# Patient Record
Sex: Male | Born: 1985
Health system: Southern US, Community
[De-identification: ages and names within clinical notes are randomized; demographics above are authoritative.]

---

## 2015-07-14 DIAGNOSIS — H5213 Myopia, bilateral: Secondary | ICD-10-CM | POA: Diagnosis not present

## 2016-07-30 DIAGNOSIS — H5213 Myopia, bilateral: Secondary | ICD-10-CM | POA: Diagnosis not present

## 2017-03-12 MED FILL — diazePAM 10 MG TABS: 10 | 1 days supply | Qty: 1 | Fill #0

## 2017-03-12 MED FILL — HYDROCODON-APAP 10-325: 10-325 | 3 days supply | Qty: 15 | Fill #0

## 2017-03-12 MED FILL — CEPHALEXIN 500 MG CAPSULE: 500 | 5 days supply | Qty: 15 | Fill #0

## 2017-03-19 DIAGNOSIS — N4601 Organic azoospermia: Secondary | ICD-10-CM | POA: Insufficient documentation

## 2017-05-12 NOTE — Progress Notes (Addendum)
Subjective:    Patient ID: John Barnes, male    DOB: Jun 10, 1985, 32 y.o.   MRN: [id number]  HPI:  John Barnes is here to establish as a new pt.  He is a pleasant 32 year old male.  PMH: He denies chronic medical conditions/daily medications. He estimates to drink >80 oz water/day and intermittently follows low CHO diets, he has lost >25 lbs since Fall 2018 He denies formal exercise, but is interested in resuming regular wt training program. He enjoys an occasional beer and uses smokeless tobacco- 1 can will last 4 days. He is happily married and has three children ages 61,11, 48 He is a Presenter, broadcasting   Patient Care Team    Relationship Specialty Notifications Start End  Danford, Jinny Blossom, NP PCP - General Family Medicine  04/06/17     Patient Active Problem List   Diagnosis Date Noted  . Healthcare maintenance 05/13/2017     History reviewed. No pertinent past medical history.   History reviewed. No pertinent surgical history.   Family History  Problem Relation Age of Onset  . Diabetes Maternal Grandfather      Social History   Substance and Sexual Activity  Drug Use Never     Social History   Substance and Sexual Activity  Alcohol Use Yes   Comment: occ     Social History   Tobacco Use  Smoking Status Never Smoker  Smokeless Tobacco Never Used     No outpatient encounter medications on file as of 05/13/2017.   No facility-administered encounter medications on file as of 05/13/2017.     Allergies: Patient has no known allergies.  Body mass index is 29.14 kg/m.  Blood pressure 123/76, pulse 90, height 5\' 9"  (1.753 m), weight 197 lb 4.8 oz (89.5 kg), SpO2 98 %.  Review of Systems  Constitutional: Negative for activity change, appetite change, chills, diaphoresis, fatigue, fever and unexpected weight change.  Eyes: Negative for visual disturbance.  Respiratory: Negative for cough, chest tightness, shortness of breath, wheezing and stridor.    Cardiovascular: Negative for chest pain, palpitations and leg swelling.  Gastrointestinal: Negative for abdominal distention, abdominal pain, blood in stool, constipation, diarrhea, nausea and vomiting.  Genitourinary: Negative for difficulty urinating, flank pain and hematuria.  Musculoskeletal: Negative for arthralgias, back pain, gait problem, joint swelling, myalgias, neck pain and neck stiffness.  Skin: Negative for color change, pallor, rash and wound.  Neurological: Negative for dizziness and headaches.  Hematological: Does not bruise/bleed easily.  Psychiatric/Behavioral: Negative for decreased concentration, dysphoric mood, hallucinations, self-injury, sleep disturbance and suicidal ideas. The patient is not nervous/anxious and is not hyperactive.        Objective:   Physical Exam  Constitutional: He appears well-developed and well-nourished. No distress.  HENT:  Head: Normocephalic and atraumatic.  Right Ear: External ear normal.  Left Ear: External ear normal.  Eyes: Pupils are equal, round, and reactive to light. Conjunctivae are normal.  Cardiovascular: Normal rate, regular rhythm, normal heart sounds and intact distal pulses.  No murmur heard. Pulmonary/Chest: Effort normal and breath sounds normal. No respiratory distress. He has no wheezes. He has no rales. He exhibits no tenderness.  Neurological: He is alert.  Skin: Skin is warm and dry. No rash noted. He is not diaphoretic. No erythema. No pallor.  Psychiatric: He has a normal mood and affect. His behavior is normal. Judgment and thought content normal.  Nursing note and vitals reviewed.     Assessment & Plan:  1. Healthcare maintenance     Healthcare maintenance Increase regular exercise, get that weight lifting group together! Continue excellent water intake and try to reduce-stop smokeless tobacco use. Recommend fasting labs this summer, then schedule complete physical a week later.    FOLLOW-UP:   Return in about 3 months (around 08/12/2017) for Fasting Labs, CPE.

## 2017-05-13 ENCOUNTER — Encounter: Payer: Self-pay | Admitting: Adult Health

## 2017-05-13 ENCOUNTER — Ambulatory Visit (INDEPENDENT_AMBULATORY_CARE_PROVIDER_SITE_OTHER): Payer: No Typology Code available for payment source | Admitting: Adult Health

## 2017-05-13 VITALS — BP 123/76 | HR 90 | Ht 69.0 in | Wt 197.3 lb

## 2017-05-13 DIAGNOSIS — Z0000 Encounter for general adult medical examination without abnormal findings: Secondary | ICD-10-CM | POA: Insufficient documentation

## 2017-05-13 NOTE — Assessment & Plan Note (Signed)
Increase regular exercise, get that weight lifting group together! Continue excellent water intake and try to reduce-stop smokeless tobacco use. Recommend fasting labs this summer, then schedule complete physical a week later.

## 2017-05-13 NOTE — Patient Instructions (Signed)
Mediterranean Diet A Mediterranean diet refers to food and lifestyle choices that are based on the traditions of countries located on the [place] Sea. This way of eating has been shown to help prevent certain conditions and improve outcomes for people who have chronic diseases, like kidney disease and heart disease. What are tips for following this plan? Lifestyle  Cook and eat meals together with your family, when possible.  Drink enough fluid to keep your urine clear or pale yellow.  Be physically active every day. This includes: ? Aerobic exercise like running or swimming. ? Leisure activities like gardening, walking, or housework.  Get 7-8 hours of sleep each night.  If recommended by your health care provider, drink red wine in moderation. This means 1 glass a day for nonpregnant women and 2 glasses a day for men. A glass of wine equals 5 oz (150 mL). Reading food labels  Check the serving size of packaged foods. For foods such as rice and pasta, the serving size refers to the amount of cooked product, not dry.  Check the total fat in packaged foods. Avoid foods that have saturated fat or trans fats.  Check the ingredients list for added sugars, such as corn syrup. Shopping  At the grocery store, buy most of your food from the areas near the walls of the store. This includes: ? Fresh fruits and vegetables (produce). ? Grains, beans, nuts, and seeds. Some of these may be available in unpackaged forms or large amounts (in bulk). ? Fresh seafood. ? Poultry and eggs. ? Low-fat dairy products.  Buy whole ingredients instead of prepackaged foods.  Buy fresh fruits and vegetables in-season from local farmers markets.  Buy frozen fruits and vegetables in resealable bags.  If you do not have access to quality fresh seafood, buy precooked frozen shrimp or canned fish, such as tuna, salmon, or sardines.  Buy small amounts of raw or cooked vegetables, salads, or olives from the  deli or salad bar at your store.  Stock your pantry so you always have certain foods on hand, such as olive oil, canned tuna, canned tomatoes, rice, pasta, and beans. Cooking  Cook foods with extra-virgin olive oil instead of using butter or other vegetable oils.  Have meat as a side dish, and have vegetables or grains as your main dish. This means having meat in small portions or adding small amounts of meat to foods like pasta or stew.  Use beans or vegetables instead of meat in common dishes like chili or lasagna.  Experiment with different cooking methods. Try roasting or broiling vegetables instead of steaming or sauteing them.  Add frozen vegetables to soups, stews, pasta, or rice.  Add nuts or seeds for added healthy fat at each meal. You can add these to yogurt, salads, or vegetable dishes.  Marinate fish or vegetables using olive oil, lemon juice, garlic, and fresh herbs. Meal planning  Plan to eat 1 vegetarian meal one day each week. Try to work up to 2 vegetarian meals, if possible.  Eat seafood 2 or more times a week.  Have healthy snacks readily available, such as: ? Vegetable sticks with hummus. ? Greek yogurt. ? Fruit and nut trail mix.  Eat balanced meals throughout the week. This includes: ? Fruit: 2-3 servings a day ? Vegetables: 4-5 servings a day ? Low-fat dairy: 2 servings a day ? Fish, poultry, or lean meat: 1 serving a day ? Beans and legumes: 2 or more servings a week ? Nuts   and seeds: 1-2 servings a day ? Whole grains: 6-8 servings a day ? Extra-virgin olive oil: 3-4 servings a day  Limit red meat and sweets to only a few servings a month What are my food choices?  Mediterranean diet ? Recommended ? Grains: Whole-grain pasta. Brown rice. Bulgar wheat. Polenta. Couscous. Whole-wheat bread. Orpah Cobbatmeal. Quinoa. ? Vegetables: Artichokes. Beets. Broccoli. Cabbage. Carrots. Eggplant. Green beans. Chard. Kale. Spinach. Onions. Leeks. Peas. Squash.  Tomatoes. Peppers. Radishes. ? Fruits: Apples. Apricots. Avocado. Berries. Bananas. Cherries. Dates. Figs. Grapes. Lemons. Melon. Oranges. Peaches. Plums. Pomegranate. ? Meats and other protein foods: Beans. Almonds. Sunflower seeds. Pine nuts. Peanuts. Cod. Salmon. Scallops. Shrimp. Tuna. Tilapia. Clams. Oysters. Eggs. ? Dairy: Low-fat milk. Cheese. Greek yogurt. ? Beverages: Water. Red wine. Herbal tea. ? Fats and oils: Extra virgin olive oil. Avocado oil. Grape seed oil. ? Sweets and desserts: [place] yogurt with honey. Baked apples. Poached pears. Trail mix. ? Seasoning and other foods: Basil. Cilantro. Coriander. Cumin. Mint. Parsley. Sage. Rosemary. Tarragon. Garlic. Oregano. Thyme. Pepper. Balsalmic vinegar. Tahini. Hummus. Tomato sauce. Olives. Mushrooms. ? Limit these ? Grains: Prepackaged pasta or rice dishes. Prepackaged cereal with added sugar. ? Vegetables: Deep fried potatoes (french fries). ? Fruits: Fruit canned in syrup. ? Meats and other protein foods: Beef. Pork. Lamb. Poultry with skin. Hot dogs. Tomasa BlaseBacon. ? Dairy: Ice cream. Sour cream. Whole milk. ? Beverages: Juice. Sugar-sweetened soft drinks. Beer. Liquor and spirits. ? Fats and oils: Butter. Canola oil. Vegetable oil. Beef fat (tallow). Lard. ? Sweets and desserts: Cookies. Cakes. Pies. Candy. ? Seasoning and other foods: Mayonnaise. Premade sauces and marinades. ? The items listed may not be a complete list. Talk with your dietitian about what dietary choices are right for you. Summary  The Mediterranean diet includes both food and lifestyle choices.  Eat a variety of fresh fruits and vegetables, beans, nuts, seeds, and whole grains.  Limit the amount of red meat and sweets that you eat.  Talk with your health care provider about whether it is safe for you to drink red wine in moderation. This means 1 glass a day for nonpregnant women and 2 glasses a day for men. A glass of wine equals 5 oz (150 mL). This information  is not intended to replace advice given to you by your health care provider. Make sure you discuss any questions you have with your health care provider. Document Released: 08/29/2015 Document Revised: 10/01/2015 Document Reviewed: 08/29/2015 Elsevier Interactive Patient Education  2018 [employer]  Increase regular exercise, get that weight lifting group together! Continue excellent water intake and try to reduce-stop smokeless tobacco use. Recommend fasting labs this summer, then schedule complete physical a week later. WELCOME TO THE PRACTICE!

## 2017-08-02 ENCOUNTER — Other Ambulatory Visit: Payer: Self-pay

## 2017-08-02 ENCOUNTER — Other Ambulatory Visit (INDEPENDENT_AMBULATORY_CARE_PROVIDER_SITE_OTHER): Payer: No Typology Code available for payment source

## 2017-08-02 DIAGNOSIS — Z0000 Encounter for general adult medical examination without abnormal findings: Secondary | ICD-10-CM

## 2017-08-02 MED FILL — CHLORHEXIDINE 0.12% RINSE: 0.12 | 15 days supply | Qty: 473 | Fill #0

## 2017-08-03 LAB — LIPID PANEL
CHOL/HDL RATIO: 7.2 ratio — AB (ref 0.0–5.0)
Cholesterol, Total: 194 mg/dL (ref 100–199)
HDL: 27 mg/dL — AB (ref >39)
LDL CALC: 135 mg/dL — AB (ref 0–99)
Triglycerides: 158 mg/dL — ABNORMAL HIGH (ref 0–149)
VLDL Cholesterol Cal: 32 mg/dL (ref 5–40)

## 2017-08-03 LAB — COMPREHENSIVE METABOLIC PANEL
A/G RATIO: 2.2 (ref 1.2–2.2)
ALBUMIN: 4.6 g/dL (ref 3.5–5.5)
ALT: 20 IU/L (ref 0–44)
AST: 17 IU/L (ref 0–40)
Alkaline Phosphatase: 55 IU/L (ref 39–117)
BILIRUBIN TOTAL: 0.3 mg/dL (ref 0.0–1.2)
BUN / CREAT RATIO: 15 (ref 9–20)
BUN: 14 mg/dL (ref 6–20)
CHLORIDE: 104 mmol/L (ref 96–106)
CO2: 21 mmol/L (ref 20–29)
Calcium: 9.6 mg/dL (ref 8.7–10.2)
Creatinine, Ser: 0.95 mg/dL (ref 0.76–1.27)
GFR calc non Af Amer: 107 mL/min/{1.73_m2} (ref >59)
GFR, EST AFRICAN AMERICAN: 124 mL/min/{1.73_m2} (ref >59)
GLOBULIN, TOTAL: 2.1 g/dL (ref 1.5–4.5)
Glucose: 78 mg/dL (ref 65–99)
Potassium: 4.5 mmol/L (ref 3.5–5.2)
SODIUM: 140 mmol/L (ref 134–144)
Total Protein: 6.7 g/dL (ref 6.0–8.5)

## 2017-08-03 LAB — CBC WITH DIFFERENTIAL/PLATELET
Basophils Absolute: 0.1 10*3/uL (ref 0.0–0.2)
Basos: 1 %
EOS (ABSOLUTE): 0.2 10*3/uL (ref 0.0–0.4)
Eos: 3 %
HEMOGLOBIN: 15.3 g/dL (ref 13.0–17.7)
Hematocrit: 46.1 % (ref 37.5–51.0)
Immature Grans (Abs): 0 10*3/uL (ref 0.0–0.1)
Immature Granulocytes: 0 %
LYMPHS ABS: 1.6 10*3/uL (ref 0.7–3.1)
Lymphs: 30 %
MCH: 28.4 pg (ref 26.6–33.0)
MCHC: 33.2 g/dL (ref 31.5–35.7)
MCV: 86 fL (ref 79–97)
MONOCYTES: 8 %
MONOS ABS: 0.4 10*3/uL (ref 0.1–0.9)
NEUTROS ABS: 3.1 10*3/uL (ref 1.4–7.0)
Neutrophils: 58 %
Platelets: 277 10*3/uL (ref 150–450)
RBC: 5.38 x10E6/uL (ref 4.14–5.80)
RDW: 13.3 % (ref 12.3–15.4)
WBC: 5.3 10*3/uL (ref 3.4–10.8)

## 2017-08-03 LAB — HEMOGLOBIN A1C
Est. average glucose Bld gHb Est-mCnc: 100 mg/dL
Hgb A1c MFr Bld: 5.1 % (ref 4.8–5.6)

## 2017-08-03 LAB — TSH: TSH: 1.47 u[IU]/mL (ref 0.450–4.500)

## 2017-08-05 NOTE — Progress Notes (Signed)
Subjective:    Patient ID: John Barnes, male    DOB: May 14, 1985, 32 y.o.   MRN: [id number]  HPI:  05/13/17 OV: John Barnes is here to establish as a new pt.  He is a pleasant 32 year old male.  PMH: He denies chronic medical conditions/daily medications. He estimates to drink >80 oz water/day and intermittently follows low CHO diets, he has lost >25 lbs since Fall 2018 He denies formal exercise, but is interested in resuming regular wt training program. He enjoys an occasional beer and uses smokeless tobacco- 1 can will last 4 days. 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  08/09/17 OV: John Barnes is here for CPE He denies acute complaints He estimates to drink >80 ox water/day His diet consists of "whatever I want to eat", he has gained >5 lbs since last OV in Jan 2019 He denies cigarette use, however uses smokeless tobacco- estimates 1.5 cans will last/week He declined tobacco cessation today He denies formal exercise, however enjoys hiking with his family He seldom drinks ETOH Reviewed all recent labs- LDL  Reviewed most recent labs- LDL elevated 135, HDL 27  Healthcare Maintenance: Immunizations-Tdap updated today Colonoscopy-not indicated   Patient Care Team    Relationship Specialty Notifications Start End  Julaine Fusianford, Katy D, NP PCP - General Family Medicine  04/06/17     Patient Active Problem List   Diagnosis Date Noted  . Elevated LDL cholesterol level 08/09/2017  . Smokeless tobacco use 08/09/2017  . Healthcare maintenance 05/13/2017     History reviewed. No pertinent past medical history.   History reviewed. No pertinent surgical history.   Family History  Problem Relation Age of Onset  . Diabetes Maternal Grandfather      Social History   Substance and Sexual Activity  Drug Use Never     Social History   Substance and Sexual Activity  Alcohol Use Yes   Comment: occ     Social History   Tobacco  Use  Smoking Status Never Smoker  Smokeless Tobacco Never Used     No outpatient encounter medications on file as of 08/09/2017.   No facility-administered encounter medications on file as of 08/09/2017.     Allergies: Patient has no known allergies.  Body mass index is 30.05 kg/m.  Blood pressure 125/73, pulse 85, height 5\' 9"  (1.753 m), weight 203 lb 8 oz (92.3 kg), SpO2 99 %.  Review of Systems  Constitutional: Negative for activity change, appetite change, chills, diaphoresis, fatigue, fever and unexpected weight change.  Eyes: Negative for visual disturbance.  Respiratory: Negative for cough, chest tightness, shortness of breath, wheezing and stridor.   Cardiovascular: Negative for chest pain, palpitations and leg swelling.  Gastrointestinal: Negative for abdominal distention, abdominal pain, blood in stool, constipation, diarrhea, nausea and vomiting.  Genitourinary: Negative for difficulty urinating, flank pain and hematuria.  Musculoskeletal: Negative for arthralgias, back pain, gait problem, joint swelling, myalgias, neck pain and neck stiffness.  Skin: Negative for color change, pallor, rash and wound.  Neurological: Negative for dizziness and headaches.  Hematological: Does not bruise/bleed easily.  Psychiatric/Behavioral: Negative for decreased concentration, dysphoric mood, hallucinations, self-injury, sleep disturbance and suicidal ideas. The patient is not nervous/anxious and is not hyperactive.        Objective:   Physical Exam  Constitutional: He is oriented to person, place, and time. He appears well-developed and well-nourished. No distress.  HENT:  Head: Normocephalic and atraumatic.  Right Ear: External ear normal. Tympanic membrane is not erythematous and not bulging. No decreased hearing is noted.  Left Ear: External ear normal. Tympanic membrane is not erythematous and not bulging. No decreased hearing is noted.  Nose: No mucosal edema or rhinorrhea.  Right sinus exhibits no maxillary sinus tenderness and no frontal sinus tenderness. Left sinus exhibits no maxillary sinus tenderness and no frontal sinus tenderness.  Mouth/Throat: Uvula is midline, oropharynx is clear and moist and mucous membranes are normal.  Eyes: Pupils are equal, round, and reactive to light. Conjunctivae are normal.  Neck: Normal range of motion. Neck supple.  Cardiovascular: Normal rate, regular rhythm, normal heart sounds and intact distal pulses.  No murmur heard. Pulmonary/Chest: Effort normal and breath sounds normal. No respiratory distress. He has no wheezes. He has no rales. He exhibits no tenderness.  Abdominal: Soft. Bowel sounds are normal. He exhibits no distension and no mass. There is no tenderness. There is no rebound and no guarding.  Genitourinary:  Genitourinary Comments: Declined DRE/genital exam  Musculoskeletal: Normal range of motion.  Lymphadenopathy:    He has no cervical adenopathy.  Neurological: He is alert and oriented to person, place, and time. Coordination normal.  Skin: Skin is warm and dry. No rash noted. He is not diaphoretic. No erythema. No pallor.  Psychiatric: He has a normal mood and affect. His behavior is normal. Judgment and thought content normal.  Nursing note and vitals reviewed.     Assessment & Plan:   1. Screening for HIV (human immunodeficiency virus)   2. Need for Tdap vaccination   3. Healthcare maintenance   4. Elevated LDL cholesterol level   5. Smokeless tobacco use     Healthcare maintenance Increase water intake, strive for at least 100 ounces/day.   Follow Mediterranean diet Increase regular exercise.  Recommend at least 30 minutes daily, 5 days per week of walking, jogging, biking, swimming, YouTube/Pinterest workout videos. Recommend annual physical with fasting labs.  Elevated LDL cholesterol level 08/02/17 Tot 194 TGs 158 HDL 27 LDL 135 Mediterranean diet, increase regular  exercise  Smokeless tobacco use 1.5 can over 1 week Declined tobacco cessation  FOLLOW-UP:  Return in about 1 year (around 08/10/2018).

## 2017-08-09 ENCOUNTER — Encounter: Payer: Self-pay | Admitting: Adult Health

## 2017-08-09 ENCOUNTER — Ambulatory Visit (INDEPENDENT_AMBULATORY_CARE_PROVIDER_SITE_OTHER): Payer: No Typology Code available for payment source | Admitting: Adult Health

## 2017-08-09 VITALS — BP 125/73 | HR 85 | Ht 69.0 in | Wt 203.5 lb

## 2017-08-09 DIAGNOSIS — Z720 Tobacco use: Secondary | ICD-10-CM | POA: Diagnosis not present

## 2017-08-09 DIAGNOSIS — Z23 Encounter for immunization: Secondary | ICD-10-CM

## 2017-08-09 DIAGNOSIS — Z114 Encounter for screening for human immunodeficiency virus [HIV]: Secondary | ICD-10-CM

## 2017-08-09 DIAGNOSIS — Z0000 Encounter for general adult medical examination without abnormal findings: Secondary | ICD-10-CM

## 2017-08-09 DIAGNOSIS — E7800 Pure hypercholesterolemia, unspecified: Secondary | ICD-10-CM | POA: Diagnosis not present

## 2017-08-09 NOTE — Patient Instructions (Signed)
Mediterranean Diet A Mediterranean diet refers to food and lifestyle choices that are based on the traditions of countries located on the [place]. This way of eating has been shown to help prevent certain conditions and improve outcomes for people who have chronic diseases, like kidney disease and heart disease. What are tips for following this plan? Lifestyle  Cook and eat meals together with your family, when possible.  Drink enough fluid to keep your urine clear or pale yellow.  Be physically active every day. This includes: ? Aerobic exercise like running or swimming. ? Leisure activities like gardening, walking, or housework.  Get 7-8 hours of sleep each night.  If recommended by your health care provider, drink red wine in moderation. This means 1 glass a day for nonpregnant women and 2 glasses a day for men. A glass of wine equals 5 oz (150 mL). Reading food labels  Check the serving size of packaged foods. For foods such as rice and pasta, the serving size refers to the amount of cooked product, not dry.  Check the total fat in packaged foods. Avoid foods that have saturated fat or trans fats.  Check the ingredients list for added sugars, such as corn syrup. Shopping  At the grocery store, buy most of your food from the areas near the walls of the store. This includes: ? Fresh fruits and vegetables (produce). ? Grains, beans, nuts, and seeds. Some of these may be available in unpackaged forms or large amounts (in bulk). ? Fresh seafood. ? Poultry and eggs. ? Low-fat dairy products.  Buy whole ingredients instead of prepackaged foods.  Buy fresh fruits and vegetables in-season from local farmers markets.  Buy frozen fruits and vegetables in resealable bags.  If you do not have access to quality fresh seafood, buy precooked frozen shrimp or canned fish, such as tuna, salmon, or sardines.  Buy small amounts of raw or cooked vegetables, salads, or olives from the  deli or salad bar at your store.  Stock your pantry so you always have certain foods on hand, such as olive oil, canned tuna, canned tomatoes, rice, pasta, and beans. Cooking  Cook foods with extra-virgin olive oil instead of using butter or other vegetable oils.  Have meat as a side dish, and have vegetables or grains as your main dish. This means having meat in small portions or adding small amounts of meat to foods like pasta or stew.  Use beans or vegetables instead of meat in common dishes like chili or lasagna.  Experiment with different cooking methods. Try roasting or broiling vegetables instead of steaming or sauteing them.  Add frozen vegetables to soups, stews, pasta, or rice.  Add nuts or seeds for added healthy fat at each meal. You can add these to yogurt, salads, or vegetable dishes.  Marinate fish or vegetables using olive oil, lemon juice, garlic, and fresh herbs. Meal planning  Plan to eat 1 vegetarian meal one day each week. Try to work up to 2 vegetarian meals, if possible.  Eat seafood 2 or more times a week.  Have healthy snacks readily available, such as: ? Vegetable sticks with hummus. ? Greek yogurt. ? Fruit and nut trail mix.  Eat balanced meals throughout the week. This includes: ? Fruit: 2-3 servings a day ? Vegetables: 4-5 servings a day ? Low-fat dairy: 2 servings a day ? Fish, poultry, or lean meat: 1 serving a day ? Beans and legumes: 2 or more servings a week ? Nuts   and seeds: 1-2 servings a day ? Whole grains: 6-8 servings a day ? Extra-virgin olive oil: 3-4 servings a day  Limit red meat and sweets to only a few servings a month What are my food choices?  Mediterranean diet ? Recommended ? Grains: Whole-grain pasta. Brown rice. Bulgar wheat. Polenta. Couscous. Whole-wheat bread. [place]. ? Vegetables: Artichokes. Beets. Broccoli. Cabbage. Carrots. Eggplant. Green beans. Chard. Kale. Spinach. Onions. Leeks. Peas. Squash.  Tomatoes. Peppers. Radishes. ? Fruits: Apples. Apricots. Avocado. Berries. Bananas. Cherries. Dates. Figs. Grapes. Lemons. Melon. Oranges. Peaches. Plums. Pomegranate. ? Meats and other protein foods: Beans. Almonds. Sunflower seeds. Pine nuts. Peanuts. [place]. Salmon. Scallops. Shrimp. [place]. Tilapia. Clams. Oysters. Eggs. ? Dairy: Low-fat milk. Cheese. Greek yogurt. ? Beverages: Water. Red wine. Herbal tea. ? Fats and oils: Extra virgin olive oil. Avocado oil. Grape seed oil. ? Sweets and desserts: [place] yogurt with honey. Baked apples. Poached pears. Trail mix. ? Seasoning and other foods: Basil. Cilantro. Coriander. Cumin. Mint. Parsley. Sage. Rosemary. Tarragon. Garlic. Oregano. Thyme. Pepper. Balsalmic vinegar. Tahini. Hummus. Tomato sauce. Olives. Mushrooms. ? Limit these ? Grains: Prepackaged pasta or rice dishes. Prepackaged cereal with added sugar. ? Vegetables: Deep fried potatoes (french fries). ? Fruits: Fruit canned in syrup. ? Meats and other protein foods: Beef. Pork. Lamb. Poultry with skin. Hot dogs. Berniece Salines. ? Dairy: Ice cream. Sour cream. Whole milk. ? Beverages: Juice. Sugar-sweetened soft drinks. Beer. Liquor and spirits. ? Fats and oils: Butter. Canola oil. Vegetable oil. Beef fat (tallow). Lard. ? Sweets and desserts: Cookies. Cakes. Pies. Candy. ? Seasoning and other foods: Mayonnaise. Premade sauces and marinades. ? The items listed may not be a complete list. Talk with your dietitian about what dietary choices are right for you. Summary  The Mediterranean diet includes both food and lifestyle choices.  Eat a variety of fresh fruits and vegetables, beans, nuts, seeds, and whole grains.  Limit the amount of red meat and sweets that you eat.  Talk with your health care provider about whether it is safe for you to drink red wine in moderation. This means 1 glass a day for nonpregnant women and 2 glasses a day for men. A glass of wine equals 5 oz (150 mL). This information  is not intended to replace advice given to you by your health care provider. Make sure you discuss any questions you have with your health care provider. Document Released: 08/29/2015 Document Revised: 10/01/2015 Document Reviewed: 08/29/2015 Elsevier Interactive Patient Education  2018 [employer].    Exercising to [employer] Exercising can help you to lose weight. In order to lose weight through exercise, you need to do vigorous-intensity exercise. You can tell that you are exercising with vigorous intensity if you are breathing very hard and fast and cannot hold a conversation while exercising. Moderate-intensity exercise helps to maintain your current weight. You can tell that you are exercising at a moderate level if you have a higher heart rate and faster breathing, but you are still able to hold a conversation. How often should I exercise? Choose an activity that you enjoy and set realistic goals. Your health care provider can help you to make an activity plan that works for you. Exercise regularly as directed by your health care provider. This may include:  Doing resistance training twice each week, such as: ? Push-ups. ? Sit-ups. ? Lifting weights. ? Using resistance bands.  Doing a given intensity of exercise for a given amount of time. Choose from these options: ?  150 minutes of moderate-intensity exercise every week. ? 75 minutes of vigorous-intensity exercise every week. ? A mix of moderate-intensity and vigorous-intensity exercise every week.  Children, pregnant women, people who are out of shape, people who are overweight, and older adults may need to consult a health care provider for individual recommendations. If you have any sort of medical condition, be sure to consult your health care provider before starting a new exercise program. What are some activities that can help me to lose weight?  Walking at a rate of at least 4.5 miles an hour.  Jogging or running at a  rate of 5 miles per hour.  Biking at a rate of at least 10 miles per hour.  Lap swimming.  Roller-skating or in-line skating.  Cross-country skiing.  Vigorous competitive sports, such as football, basketball, and soccer.  Jumping rope.  Aerobic dancing. How can I be more active in my day-to-day activities?  Use the stairs instead of the elevator.  Take a walk during your lunch break.  If you drive, park your car farther away from work or school.  If you take public transportation, get off one stop early and walk the rest of the way.  Make all of your phone calls while standing up and walking around.  Get up, stretch, and walk around every 30 minutes throughout the day. What guidelines should I follow while exercising?  Do not exercise so much that you hurt yourself, feel dizzy, or get very short of breath.  Consult your health care provider prior to starting a new exercise program.  Wear comfortable clothes and shoes with good support.  Drink plenty of water while you exercise to prevent dehydration or heat stroke. Body water is lost during exercise and must be replaced.  Work out until you breathe faster and your heart beats faster. This information is not intended to replace advice given to you by your health care provider. Make sure you discuss any questions you have with your health care provider. Document Released: 02/07/2010 Document Revised: 06/13/2015 Document Reviewed: 06/08/2013 Elsevier Interactive Patient Education  2018 [employer]  Increase water intake, strive for at least 100 ounces/day.   Follow Mediterranean diet Increase regular exercise.  Recommend at least 30 minutes daily, 5 days per week of walking, jogging, biking, swimming, YouTube/Pinterest workout videos. Recommend annual physical with fasting labs. NICE TO SEE YOU!

## 2017-08-09 NOTE — Assessment & Plan Note (Signed)
08/02/17 Tot 194 TGs 158 HDL 27 LDL 135 Mediterranean diet, increase regular exercise

## 2017-08-09 NOTE — Assessment & Plan Note (Signed)
1.5 can over 1 week Declined tobacco cessation

## 2017-08-09 NOTE — Assessment & Plan Note (Signed)
Increase water intake, strive for at least 100 ounces/day.   Follow Mediterranean diet Increase regular exercise.  Recommend at least 30 minutes daily, 5 days per week of walking, jogging, biking, swimming, YouTube/Pinterest workout videos. Recommend annual physical with fasting labs.

## 2017-08-10 LAB — HIV ANTIBODY (ROUTINE TESTING W REFLEX): HIV SCREEN 4TH GENERATION: NONREACTIVE

## 2018-03-15 ENCOUNTER — Telehealth: Payer: No Typology Code available for payment source | Admitting: Physician Assistant

## 2018-03-15 DIAGNOSIS — B027 Disseminated zoster: Secondary | ICD-10-CM

## 2018-03-15 NOTE — Progress Notes (Signed)
You have what appears to be a shingles rash.  It is very odd that it would be on 2 different dermatomes.  I recommend that you seek the care of urgent care or the emergency room.   NOTE: If you entered your credit card information for this eVisit, you will not be charged. You may see a "hold" on your card for the $30 but that hold will drop off and you will not have a charge processed.  If you are having a true medical emergency please call 911.  If you need an urgent face to face visit,  has four urgent care centers for your convenience.  If you need care fast and have a high deductible or no insurance consider:   [email or web address] to reserve your spot online an avoid wait times  [facility] [address], [address] [place], [place] [address] 8 am to 8 pm Monday-Friday 10 am to 4 pm Saturday-Sunday *Across the street from [employer]  [address] [place], [address] 8 am to 5 pm Monday-Friday * In the [facility] on the [address]   The following sites will take your  insurance:  . [facility]  [phone] Get Driving Directions Find a Provider at this Location  [address] [place], [place] [address] . 10 am to 8 pm Monday-Friday . 12 pm to 8 pm Saturday-Sunday   . [facility] at [facility]  [phone] Get Driving Directions Find a Provider at this Location  [address], [address] [place], [place] [address] . 8 am to 8 pm Monday-Friday . 9 am to 6 pm Saturday . 11 am to 6 pm Sunday   . [facility] Urgent Care at [facility]  [phone] Get Driving Directions  [address] [place], [place] [address] . 8 am to 8 pm Monday-Friday . 8 am to 4 pm Saturday-Sunday   Your e-visit answers were reviewed by a board certified advanced clinical practitioner to complete your personal care plan.  Thank you for using e-Visits.

## 2018-03-18 MED FILL — predniSONE 5 MG TABS: 5 | 22 days supply | Qty: 54 | Fill #0

## 2018-04-18 MED FILL — predniSONE 5 MG TABS: 5 | 22 days supply | Qty: 54 | Fill #1

## 2018-05-14 MED FILL — predniSONE 5 MG TABS: 5 | 22 days supply | Qty: 54 | Fill #2

## 2018-08-11 NOTE — Progress Notes (Signed)
Subjective:    Patient ID: John Barnes, male    DOB: [birth year], 33 y.o.   MRN: [id number]  HPI:  [name] is here for CPE He estimates to drink >100 oz water/day He reports a consuming eggs and cheese on a regular basis He enjoys hiking on the weekends He continues to use smokeless tobacco- one can of dip/day- declined tobacco cessation today He denies acute complaints today  07/2017 Lipid Panel- Tot 194 TGs- 158 HDL-27 LDL- 135 A1c-5.1  Needs fasting labs   Healthcare Maintenance: Immunizations-UTD  Patient Care Team    Relationship Specialty Notifications Start End  Esaw Grandchild, NP PCP - General Family Medicine  04/06/17     Patient Active Problem List   Diagnosis Date Noted  . Elevated LDL cholesterol level 08/09/2017  . Smokeless tobacco use 08/09/2017  . Healthcare maintenance 05/13/2017     History reviewed. No pertinent past medical history.   History reviewed. No pertinent surgical history.   Family History  Problem Relation Age of Onset  . Diabetes Maternal Grandfather      Social History   Substance and Sexual Activity  Drug Use Never     Social History   Substance and Sexual Activity  Alcohol Use Yes   Comment: occ     Social History   Tobacco Use  Smoking Status Never Smoker  Smokeless Tobacco Never Used     No outpatient encounter medications on file as of 08/16/2018.   No facility-administered encounter medications on file as of 08/16/2018.     Allergies: Patient has no known allergies.  Body mass index is 30.01 kg/m.  Blood pressure 108/70, pulse (!) 57, temperature 97.9 F (36.6 C), temperature source Oral, height 5' 8.5" (1.74 m), weight 200 lb 4.8 oz (90.9 kg), SpO2 98 %.  Review of Systems  Constitutional: Positive for fatigue. Negative for activity change, appetite change, chills, diaphoresis, fever and unexpected weight change.  HENT: Negative for congestion.   Eyes: Negative for visual disturbance.   Respiratory: Negative for cough, chest tightness, shortness of breath, wheezing and stridor.   Cardiovascular: Negative for chest pain, palpitations and leg swelling.  Gastrointestinal: Negative for abdominal distention, anal bleeding, blood in stool, constipation, diarrhea, nausea and vomiting.  Endocrine: Negative for cold intolerance, heat intolerance, polydipsia, polyphagia and polyuria.  Genitourinary: Negative for difficulty urinating and frequency.  Musculoskeletal: Negative for arthralgias, back pain, gait problem, joint swelling, myalgias, neck pain and neck stiffness.  Skin: Negative for color change, pallor, rash and wound.  Neurological: Negative for dizziness and headaches.  Hematological: Negative for adenopathy. Does not bruise/bleed easily.  Psychiatric/Behavioral: Negative for agitation, behavioral problems, confusion, decreased concentration, dysphoric mood, hallucinations, self-injury, sleep disturbance and suicidal ideas. The patient is not nervous/anxious and is not hyperactive.        Objective:   Physical Exam Vitals signs and nursing note reviewed.  Constitutional:      General: He is not in acute distress.    Appearance: He is normal weight. He is not ill-appearing, toxic-appearing or diaphoretic.  HENT:     Head: Normocephalic and atraumatic.     Right Ear: Tympanic membrane, ear canal and external ear normal. There is no impacted cerumen.     Left Ear: Tympanic membrane, ear canal and external ear normal. There is no impacted cerumen.     Nose: Nose normal. No congestion.     Mouth/Throat:     Mouth: Mucous membranes are moist.  Pharynx: No oropharyngeal exudate.  Eyes:     Extraocular Movements: Extraocular movements intact.     Conjunctiva/sclera: Conjunctivae normal.     Pupils: Pupils are equal, round, and reactive to light.  Neck:     Musculoskeletal: Normal range of motion. No muscular tenderness.  Cardiovascular:     Rate and Rhythm: Normal  rate and regular rhythm.     Pulses: Normal pulses.     Heart sounds: Normal heart sounds. No murmur. No friction rub. No gallop.   Pulmonary:     Effort: Pulmonary effort is normal. No respiratory distress.     Breath sounds: Normal breath sounds. No stridor. No wheezing, rhonchi or rales.  Chest:     Chest wall: No tenderness.  Abdominal:     General: Abdomen is flat. Bowel sounds are normal. There is no distension.     Palpations: There is no mass.     Tenderness: There is no abdominal tenderness. There is no right CVA tenderness, left CVA tenderness, guarding or rebound.     Hernia: No hernia is present.  Musculoskeletal: Normal range of motion.        General: No tenderness.  Skin:    General: Skin is warm and dry.     Capillary Refill: Capillary refill takes less than 2 seconds.     Coloration: Skin is not jaundiced or pale.     Findings: No bruising, erythema, lesion or rash.  Neurological:     Mental Status: He is alert and oriented to person, place, and time.     Sensory: No sensory deficit.     Coordination: Coordination normal.  Psychiatric:        Mood and Affect: Mood normal.        Behavior: Behavior normal.        Thought Content: Thought content normal.        Judgment: Judgment normal.       Assessment & Plan:   1. Healthcare maintenance   2. Elevated LDL cholesterol level   3. Smokeless tobacco use     Healthcare maintenance Remain well hydrated.   Follow Heart Healthy diet- reduce saturated fat intake by at least 25%. Increase regular exercise.  Recommend at least 30 minutes daily, 5 days per week of walking, jogging, biking, swimming, YouTube/Pinterest workout videos. We will call you when lab results are available. Continue to social distance and wear a mask when in public. Recommend annual physical, fasting labs the week prior.  Smokeless tobacco use Using can/dip per day- declined tobacco cessation today  Elevated LDL cholesterol  level 07/2017 Lipid Panel- Tot 194 TGs- 158 HDL-27 LDL- 135  Lipid Panel re-checked today  He denies known familial hypercholetoremia    FOLLOW-UP:  Return in about 1 year (around 08/16/2019) for CPE, Fasting Labs.

## 2018-08-16 ENCOUNTER — Other Ambulatory Visit: Payer: Self-pay

## 2018-08-16 ENCOUNTER — Encounter: Payer: Self-pay | Admitting: Adult Health

## 2018-08-16 ENCOUNTER — Ambulatory Visit (INDEPENDENT_AMBULATORY_CARE_PROVIDER_SITE_OTHER): Payer: No Typology Code available for payment source | Admitting: Adult Health

## 2018-08-16 VITALS — BP 108/70 | HR 57 | Temp 97.9°F | Ht 68.5 in | Wt 200.3 lb

## 2018-08-16 DIAGNOSIS — Z0000 Encounter for general adult medical examination without abnormal findings: Secondary | ICD-10-CM

## 2018-08-16 DIAGNOSIS — Z720 Tobacco use: Secondary | ICD-10-CM | POA: Diagnosis not present

## 2018-08-16 DIAGNOSIS — E7800 Pure hypercholesterolemia, unspecified: Secondary | ICD-10-CM

## 2018-08-16 NOTE — Patient Instructions (Addendum)

## 2018-08-16 NOTE — Assessment & Plan Note (Signed)
07/2017 Lipid Panel- Tot 194 TGs- 158 HDL-27 LDL- 135  Lipid Panel re-checked today  He denies known familial hypercholetoremia

## 2018-08-16 NOTE — Assessment & Plan Note (Signed)
Using can/dip per day- declined tobacco cessation today

## 2018-08-16 NOTE — Assessment & Plan Note (Signed)
Remain well hydrated.   Follow Heart Healthy diet- reduce saturated fat intake by at least 25%. Increase regular exercise.  Recommend at least 30 minutes daily, 5 days per week of walking, jogging, biking, swimming, YouTube/Pinterest workout videos. We will call you when lab results are available. Continue to social distance and wear a mask when in public. Recommend annual physical, fasting labs the week prior.

## 2018-08-17 LAB — CBC WITH DIFFERENTIAL/PLATELET
Basophils Absolute: 0 10*3/uL (ref 0.0–0.2)
Basos: 1 %
EOS (ABSOLUTE): 0.2 10*3/uL (ref 0.0–0.4)
Eos: 3 %
Hematocrit: 45 % (ref 37.5–51.0)
Hemoglobin: 15.2 g/dL (ref 13.0–17.7)
Immature Grans (Abs): 0 10*3/uL (ref 0.0–0.1)
Immature Granulocytes: 0 %
Lymphocytes Absolute: 1.7 10*3/uL (ref 0.7–3.1)
Lymphs: 29 %
MCH: 28.6 pg (ref 26.6–33.0)
MCHC: 33.8 g/dL (ref 31.5–35.7)
MCV: 85 fL (ref 79–97)
Monocytes Absolute: 0.4 10*3/uL (ref 0.1–0.9)
Monocytes: 7 %
Neutrophils Absolute: 3.6 10*3/uL (ref 1.4–7.0)
Neutrophils: 60 %
Platelets: 285 10*3/uL (ref 150–450)
RBC: 5.31 x10E6/uL (ref 4.14–5.80)
RDW: 13.1 % (ref 11.6–15.4)
WBC: 6 10*3/uL (ref 3.4–10.8)

## 2018-08-17 LAB — COMPREHENSIVE METABOLIC PANEL
ALT: 19 IU/L (ref 0–44)
AST: 15 IU/L (ref 0–40)
Albumin/Globulin Ratio: 1.9 (ref 1.2–2.2)
Albumin: 4.7 g/dL (ref 4.0–5.0)
Alkaline Phosphatase: 56 IU/L (ref 39–117)
BUN/Creatinine Ratio: 10 (ref 9–20)
BUN: 10 mg/dL (ref 6–20)
Bilirubin Total: 0.3 mg/dL (ref 0.0–1.2)
CO2: 19 mmol/L — ABNORMAL LOW (ref 20–29)
Calcium: 9.8 mg/dL (ref 8.7–10.2)
Chloride: 104 mmol/L (ref 96–106)
Creatinine, Ser: 0.99 mg/dL (ref 0.76–1.27)
GFR calc Af Amer: 117 mL/min/{1.73_m2} (ref >59)
GFR calc non Af Amer: 101 mL/min/{1.73_m2} (ref >59)
Globulin, Total: 2.5 g/dL (ref 1.5–4.5)
Glucose: 84 mg/dL (ref 65–99)
Potassium: 4.5 mmol/L (ref 3.5–5.2)
Sodium: 141 mmol/L (ref 134–144)
Total Protein: 7.2 g/dL (ref 6.0–8.5)

## 2018-08-17 LAB — TSH: TSH: 2.08 u[IU]/mL (ref 0.450–4.500)

## 2018-08-17 LAB — LIPID PANEL
Chol/HDL Ratio: 7.9 ratio — ABNORMAL HIGH (ref 0.0–5.0)
Cholesterol, Total: 213 mg/dL — ABNORMAL HIGH (ref 100–199)
HDL: 27 mg/dL — ABNORMAL LOW (ref >39)
LDL Calculated: 131 mg/dL — ABNORMAL HIGH (ref 0–99)
Triglycerides: 276 mg/dL — ABNORMAL HIGH (ref 0–149)
VLDL Cholesterol Cal: 55 mg/dL — ABNORMAL HIGH (ref 5–40)

## 2018-08-17 LAB — HEMOGLOBIN A1C
Est. average glucose Bld gHb Est-mCnc: 94 mg/dL
Hgb A1c MFr Bld: 4.9 % (ref 4.8–5.6)

## 2018-09-20 ENCOUNTER — Telehealth: Payer: No Typology Code available for payment source | Admitting: Physician Assistant

## 2018-09-20 DIAGNOSIS — R21 Rash and other nonspecific skin eruption: Secondary | ICD-10-CM | POA: Diagnosis not present

## 2018-09-20 MED ORDER — FLUCONAZOLE 150 MG PO TABS: 150.0000 mg | ORAL_TABLET | ORAL | 0 refills | Status: AC

## 2018-09-20 MED ORDER — CLOTRIMAZOLE 1 % EX CREA: TOPICAL_CREAM | Freq: Two times a day (BID) | CUTANEOUS | Status: DC

## 2018-09-20 MED FILL — FLUCONAZOLE 150 MG TABS: 150 | 28 days supply | Qty: 4 | Fill #0

## 2018-09-20 NOTE — Progress Notes (Addendum)
E-Visit for [employer]  We are sorry that you are not feeling well. Here is how we plan to help!  Based on what you shared with me it looks like you have tinea cruris, or "Jock Itch".  The symptoms of Jock Itch include red, peeling, itchy rash that affects the groin (crease where the leg meets the trunk).  This fungal infection can be spread through shared towels, clothing, bedding, or hard surfaces (particularly in moist areas) such as shower stalls, locker room floors, or pool area that has the fungus present. If you have a fungal infection on one part of your body, you can also spread it to other parts. For instance, men with a fungal infection on their feet sometimes spread it to their groin.  I am recommending:Clotrimazole 1% cream or gel, apply to area twice per day   Prescription medications are only indicated for an extensive rash or if over the counter treatments have failed.  I am prescribing:Fluconazole 150 mg once weekly for two to four weeks  HOME CARE:  . Keep affected area clean, dry, and cool. Wendee Copp with soap and shampoo after sports or exercise and dry yourself well after bathing or swimming . Wear cotton underwear and change them if they become damp or sweaty. . Avoid using swimming pools, public showers, or baths.  GET HELP RIGHT AWAY IF:  . Symptoms that don't away after treatment. . You have any concern for STD . Severe itching that persists. . If your rash spreads or swells. . If your rash becomes very painful.  . If your rash begins to have drainage or smell. . You develop a fever.  MAKE SURE YOU    Understand these instructions.  Will watch your condition.  Will get help right away if you are not doing well or get worse.  Thank you for choosing an e-visit.  Your e-visit answers were reviewed by a board certified advanced clinical practitioner to complete your personal care plan. Depending upon the condition, your plan could have included both over the  counter or prescription medications.  Please review your pharmacy choice. Make sure the pharmacy is open so you can pick up prescription now. If there is a problem, you may contact your provider through [employer] and have the prescription routed to another pharmacy.  Your safety is important to [place]. If you have drug allergies check your prescription carefully.   For the next 24 hours you can use MyChart to ask questions about today's visit, request a non-urgent call back, or ask for a work or school excuse.  You will get an email in the next two days asking about your experience. I hope that your e-visit has been valuable and will speed your recovery   References or for more information:  SocialFulfillment.hu https://hebert-johnson.[email or web address] BetaTrainer.de?search=jock%[email or web address]&source=search_result&selectedTitle=3~52&usage_type=default&display_rank=3  Greater than 5 minutes, yet less than 10 minutes of time have been spent researching, coordinating, and implementing care for this patient today.

## 2019-02-20 ENCOUNTER — Encounter: Payer: Self-pay | Admitting: Adult Health

## 2019-02-20 ENCOUNTER — Ambulatory Visit
Admission: RE | Admit: 2019-02-20 | Discharge: 2019-02-20 | Disposition: A | Discharge status: Home / Self Care | Payer: No Typology Code available for payment source | Source: Ambulatory Visit | Attending: Adult Health | Admitting: Adult Health

## 2019-02-20 ENCOUNTER — Other Ambulatory Visit: Payer: Self-pay

## 2019-02-20 ENCOUNTER — Ambulatory Visit (INDEPENDENT_AMBULATORY_CARE_PROVIDER_SITE_OTHER): Payer: No Typology Code available for payment source | Admitting: Adult Health

## 2019-02-20 VITALS — BP 125/74 | HR 72 | Temp 98.3°F | Ht 68.5 in | Wt 218.7 lb

## 2019-02-20 DIAGNOSIS — M25572 Pain in left ankle and joints of left foot: Secondary | ICD-10-CM

## 2019-02-20 DIAGNOSIS — R600 Localized edema: Secondary | ICD-10-CM | POA: Diagnosis not present

## 2019-02-20 DIAGNOSIS — S93402A Sprain of unspecified ligament of left ankle, initial encounter: Secondary | ICD-10-CM | POA: Insufficient documentation

## 2019-02-20 NOTE — Patient Instructions (Signed)
Ankle Sprain  An ankle sprain is a stretch or tear in a ligament in the ankle. Ligaments are tissues that connect bones to each other. The two most common types of ankle sprains are:  Inversion sprain. This happens when the foot turns inward and the ankle rolls outward. It affects the ligament on the outside of the foot (lateral ligament).  Eversion sprain. This happens when the foot turns outward and the ankle rolls inward. It affects the ligament on the inner side of the foot (medial ligament). What are the causes? This condition is often caused by accidentally rolling or twisting the ankle. What increases the risk? You are more likely to develop this condition if you play sports. What are the signs or symptoms? Symptoms of this condition include:  Pain in your ankle.  Swelling.  Bruising. This may develop right after you sprain your ankle or 1-2 days later.  Trouble standing or walking, especially when you turn or change directions. How is this diagnosed? This condition is diagnosed with:  A physical exam. During the exam, your health care provider will press on certain parts of your foot and ankle and try to move them in certain ways.  X-ray imaging. These may be taken to see how severe the sprain is and to check for broken bones. How is this treated? This condition may be treated with:  A brace or splint. This is used to keep the ankle from moving until it heals.  An elastic bandage. This is used to support the ankle.  Crutches.  Pain medicine.  Surgery. This may be needed if the sprain is severe.  Physical therapy. This may help to improve the range of motion in the ankle. Follow these instructions at home: If you have a brace or a splint:  Wear the brace or splint as told by your health care provider. Remove it only as told by your health care provider.  Loosen the brace or splint if your toes tingle, become numb, or turn cold and blue.  Keep the brace or  splint clean.  If the brace or splint is not waterproof: ? Do not let it get wet. ? Cover it with a watertight covering when you take a bath or a shower. If you have an elastic bandage (dressing):  Remove it to shower or bathe.  Try not to move your ankle much, but wiggle your toes from time to time. This helps to prevent swelling.  Adjust the dressing to make it more comfortable if it feels too tight.  Loosen the dressing if you have numbness or tingling in your foot, or if your foot becomes cold and blue. Managing pain, stiffness, and swelling   Take over-the-counter and prescription medicines only as told by your health care provider.  For 2-3 days, keep your ankle raised (elevated) above the level of your heart as much as possible.  If directed, put ice on the injured area: ? If you have a removable brace or splint, remove it as told by your health care provider. ? Put ice in a plastic bag. ? Place a towel between your skin and the bag. ? Leave the ice on for 20 minutes, 2-3 times a day. General instructions  Rest your ankle.  Do not use the injured limb to support your body weight until your health care provider says that you can. Use crutches as told by your health care provider.  Do not use any products that contain nicotine or tobacco, such as   cigarettes, e-cigarettes, and chewing tobacco. If you need help quitting, ask your health care provider.  Keep all follow-up visits as told by your health care provider. This is important. Contact a health care provider if:  You have rapidly increasing bruising or swelling.  Your pain is not relieved with medicine. Get help right away if:  Your foot or toes become numb or blue.  You have severe pain that gets worse. Summary  An ankle sprain is a stretch or tear in a ligament in the ankle. Ligaments are tissues that connect bones to each other.  This condition is often caused by accidentally rolling or twisting the  ankle.  Symptoms include pain, swelling, bruising, and trouble walking.  To relieve pain and swelling, put ice on the affected ankle, raise your ankle above the level of your heart, and use an elastic bandage.  Keep all follow-up visits as told by your health care provider. This is important. This information is not intended to replace advice given to you by your health care provider. Make sure you discuss any questions you have with your health care provider. Document Revised: 09/27/2017 Document Reviewed: 06/01/2017 Elsevier Patient Education  2020 [employer].  Follow care instructions as listed above. We will contact you once Xray complete. If symptoms worsen or fail to improve in 4 weeks, follow-up with primary care. Continue to social distance and wear a mask when in public. FEEL BETTER!

## 2019-02-20 NOTE — Progress Notes (Signed)
Subjective:    Patient ID: John Barnes, male    DOB: 17-Jan-1986, 34 y.o.   MRN: [id number]  HPI:  Mr. Jurney presents L ankle pain and swelling after  fal injury that occure 48 hrs ago. He was hunting and fell well scouting for deer. He denies striking head or LOC. He was able to ambulate out on his own >1 mile out of woods. He reports initial pain 5/10, constant "pressure". He reports steady decrease in pain, however increase in swelling over lateral L ankle. He reports being able to ambulate with normal foot strike, but states "I feel unstable if I was to turn my ankle". Upon further clarification, he feels unstable if he internally rotates and applies pressure to L foot. He denies previous L ankle, however states "I used to [employer] so I think I could have done something to it in the past". He denies current pain at rest. He reports dull "pressure" when ambulating now, rated 1/10. He has been using ice and OTC NSAIDs, last dose 0900 this morning.   Patient Care Team    Relationship Specialty Notifications Start End  Julaine Fusi, NP PCP - General Family Medicine  04/06/17     Patient Active Problem List   Diagnosis Date Noted  . Left ankle sprain 02/20/2019  . Elevated LDL cholesterol level 08/09/2017  . Smokeless tobacco use 08/09/2017  . Healthcare maintenance 05/13/2017     History reviewed. No pertinent past medical history.   History reviewed. No pertinent surgical history.   Family History  Problem Relation Age of Onset  . Diabetes Maternal Grandfather      Social History   Substance and Sexual Activity  Drug Use Never     Social History   Substance and Sexual Activity  Alcohol Use Yes   Comment: occ     Social History   Tobacco Use  Smoking Status Never Smoker  Smokeless Tobacco Never Used     Outpatient Encounter Medications as of 02/20/2019  Medication Sig  . [DISCONTINUED] clotrimazole (LOTRIMIN) 1 % cream    No  facility-administered encounter medications on file as of 02/20/2019.    Allergies: Patient has no known allergies.  Body mass index is 32.77 kg/m.  Blood pressure 125/74, pulse 72, temperature 98.3 F (36.8 C), temperature source Oral, height 5' 8.5" (1.74 m), weight 218 lb 11.2 oz (99.2 kg), SpO2 96 %.  Review of Systems  Constitutional: Positive for fatigue. Negative for activity change, appetite change, chills, diaphoresis, fever and unexpected weight change.  Eyes: Negative for visual disturbance.  Respiratory: Negative for cough, chest tightness, shortness of breath, wheezing and stridor.   Cardiovascular: Negative for chest pain, palpitations and leg swelling.  Endocrine: Negative for polydipsia, polyphagia and polyuria.  Musculoskeletal: Positive for arthralgias, gait problem and joint swelling. Negative for back pain, myalgias, neck pain and neck stiffness.  Neurological: Negative for dizziness and headaches.  Hematological: Negative for adenopathy. Does not bruise/bleed easily.       Objective:   Physical Exam Vitals and nursing note reviewed.  Constitutional:      General: He is not in acute distress.    Appearance: Normal appearance. He is obese. He is not ill-appearing, toxic-appearing or diaphoretic.  HENT:     Head: Normocephalic and atraumatic.  Eyes:     Extraocular Movements: Extraocular movements intact.     Conjunctiva/sclera: Conjunctivae normal.     Pupils: Pupils are equal, round, and reactive to light.  Musculoskeletal:  Right ankle: Normal.     Right Achilles Tendon: Thompson's test negative.     Left ankle: Swelling present. Tenderness present over the lateral malleolus. No proximal fibula tenderness. Normal range of motion. Normal pulse.     Left Achilles Tendon: Normal. No tenderness or defects. Thompson's test negative.     Comments: L Ankle + edema   Skin:    Capillary Refill: Capillary refill takes less than 2 seconds.  Neurological:      Mental Status: He is alert and oriented to person, place, and time.  Psychiatric:        Attention and Perception: Attention and perception normal.        Mood and Affect: Mood is anxious.        Speech: Speech is rapid and pressured.        Behavior: Behavior normal.        Thought Content: Thought content normal.        Cognition and Memory: Cognition and memory normal.        Judgment: Judgment normal.       Assessment & Plan:   1. Edema of soft tissue of left ankle region   2. Acute left ankle pain   3. Sprain of left ankle, unspecified ligament, initial encounter     Left ankle sprain Follow care instructions as listed above. We will contact you once Xray complete. If symptoms worsen or fail to improve in 4 weeks, follow-up with primary care. Continue to social distance and wear a mask when in public.    FOLLOW-UP:  Return in about 4 weeks (around 03/20/2019), or if symptoms worsen or fail to improve.

## 2019-02-20 NOTE — Assessment & Plan Note (Signed)
Follow care instructions as listed above. We will contact you once Xray complete. If symptoms worsen or fail to improve in 4 weeks, follow-up with primary care. Continue to social distance and wear a mask when in public.

## 2019-07-14 ENCOUNTER — Telehealth: Payer: No Typology Code available for payment source | Admitting: Emergency Medicine

## 2019-07-14 DIAGNOSIS — R21 Rash and other nonspecific skin eruption: Secondary | ICD-10-CM | POA: Diagnosis not present

## 2019-07-14 MED ORDER — CLOTRIMAZOLE 1 % EX CREA: 1.0000 "application " | TOPICAL_CREAM | Freq: Two times a day (BID) | CUTANEOUS | 0 refills | Status: DC

## 2019-07-14 MED ORDER — FLUCONAZOLE 150 MG PO TABS
150.0000 mg | ORAL_TABLET | ORAL | 0 refills | Status: DC
Start: 2019-07-14 — End: 2020-10-24

## 2019-07-14 MED FILL — FLUCONAZOLE 150 MG TABS: 150 | 28 days supply | Qty: 4 | Fill #0

## 2019-07-14 NOTE — Progress Notes (Signed)
Time spent: 10 min  [name],   We are sorry that you are not feeling well. Here is how we plan to help!  Based on what you shared with me it looks like you have tinea cruris, or "Jock Itch" or tinea corporis which is a fungal infection of the creases of the skin due to heavy sweating and friction.  The symptoms include red, peeling, itchy rash that affects the groin (crease where the leg meets the trunk) or other parts of the body.  This fungal infection can be spread through shared towels, clothing, bedding, or hard surfaces (particularly in moist areas) such as shower stalls, locker room floors, or pool area that has the fungus present. If you have a fungal infection on one part of your body, you can also spread it to other parts. For instance, men with a fungal infection on their feet sometimes spread it to their groin.  I am recommending:Clotrimazole 1% cream or gel, apply to area twice per day   Prescription medications are only indicated for an extensive rash or if over the counter treatments have failed.  I am prescribing:Fluconazole 150 mg once weekly for two to four weeks  Keep in mind that this type of rash can return after treatment.  Please follow up with your primary care if your rash is recurrent to determine if you need further referral to dermatology or other testing.   HOME CARE:  . Keep affected area clean, dry, and cool. [place] with soap and shampoo after sports or exercise and dry yourself well after bathing or swimming . Wear cotton underwear and change them if they become damp or sweaty. . Avoid using swimming pools, public showers, or baths.  GET HELP RIGHT AWAY IF:  . Symptoms that don't away after treatment. . Severe itching that persists. . If your rash spreads or swells. . If your rash begins to have drainage or smell. . You develop a fever.  MAKE SURE YOU    Understand these instructions.  Will watch your condition.  Will get help right away  if you are not doing well or get worse.  Thank you for choosing an e-visit.  Your e-visit answers were reviewed by a board certified advanced clinical practitioner to complete your personal care plan. Depending upon the condition, your plan could have included both over the counter or prescription medications.  Please review your pharmacy choice. Make sure the pharmacy is open so you can pick up prescription now. If there is a problem, you may contact your provider through [employer] and have the prescription routed to another pharmacy.  Your safety is important to [place]. If you have drug allergies check your prescription carefully.   For the next 24 hours you can use MyChart to ask questions about today's visit, request a non-urgent call back, or ask for a work or school excuse.  You will get an email in the next two days asking about your experience. I hope that your e-visit has been valuable and will speed your recovery   References or for more information:  [email or web address]&source=search_result&selectedTitle=3~52&usage_type=default&display_rank=3

## 2019-08-29 ENCOUNTER — Encounter: Payer: No Typology Code available for payment source | Admitting: Physician Assistant

## 2020-10-08 IMAGING — CR DG ANKLE COMPLETE 3+V*L*
3 series · 3 of 3 positions shown · non-contrast
Comparison: None.

CLINICAL DATA: L ankle swelling/pain, injury 02/18/2019

EXAM:
LEFT ANKLE COMPLETE - 3+ VIEW

[x ankle ap left]
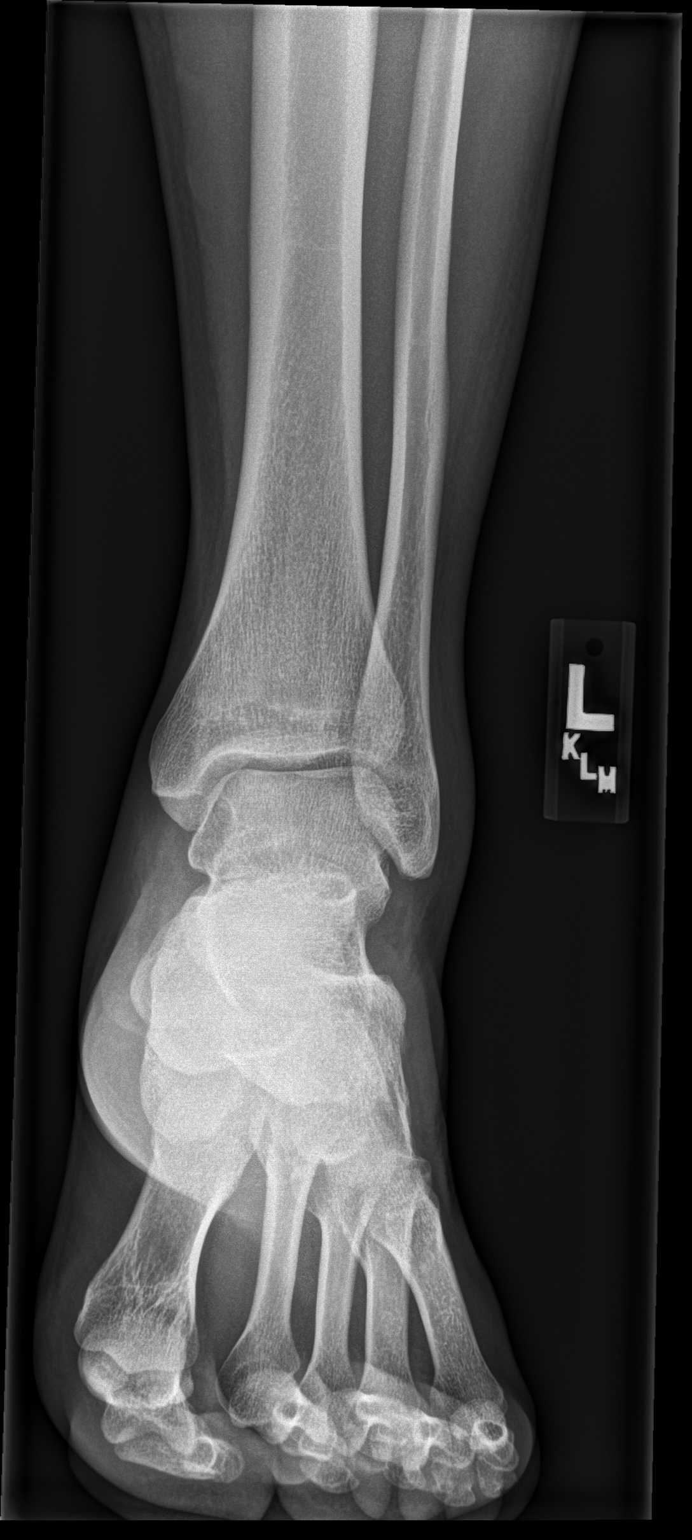

[x ankle obl left]
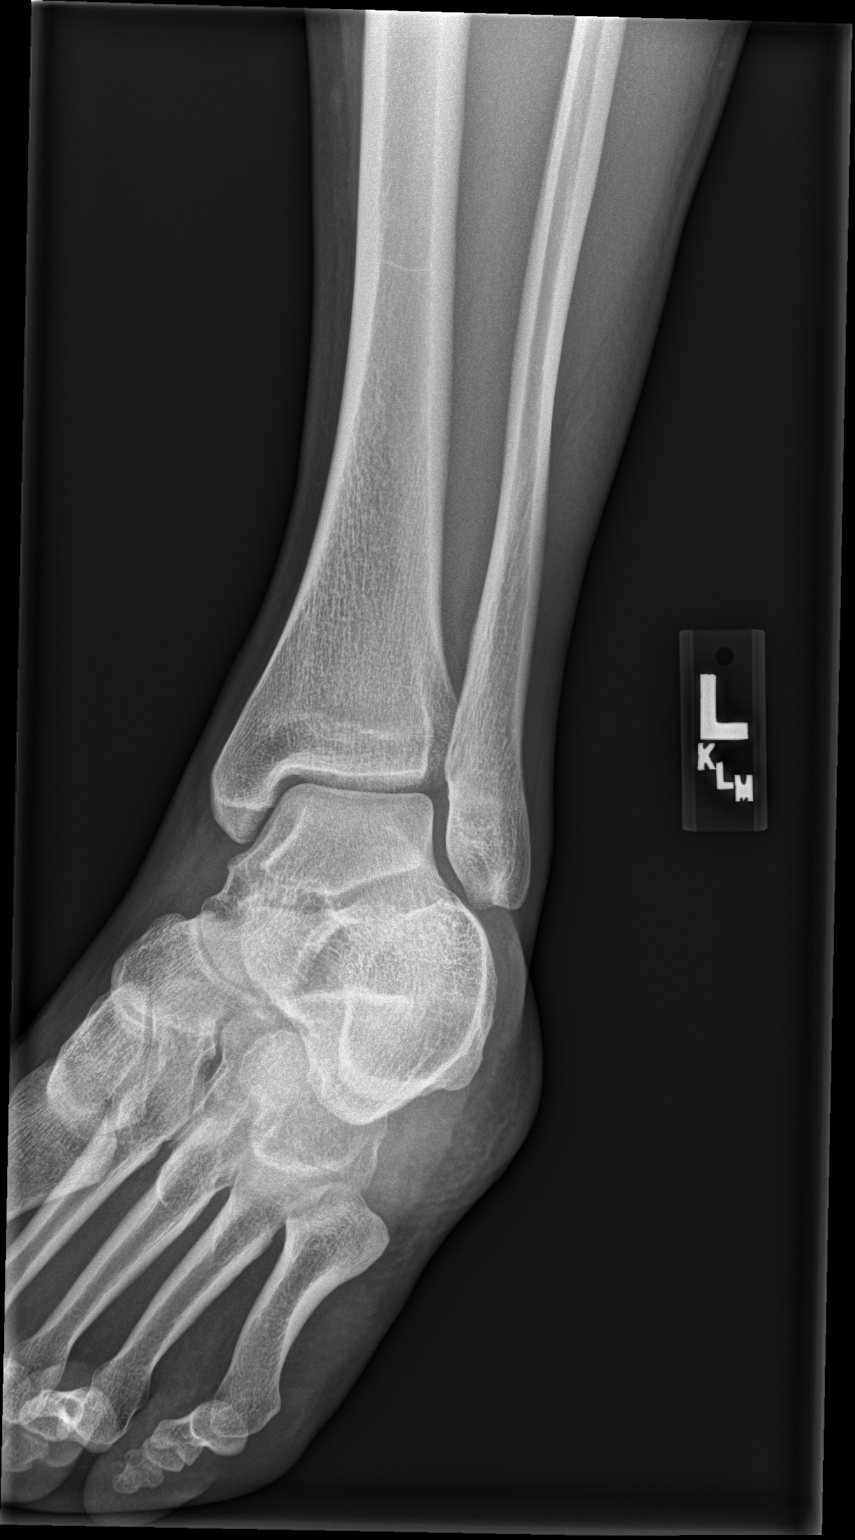

[x ankle lat left]
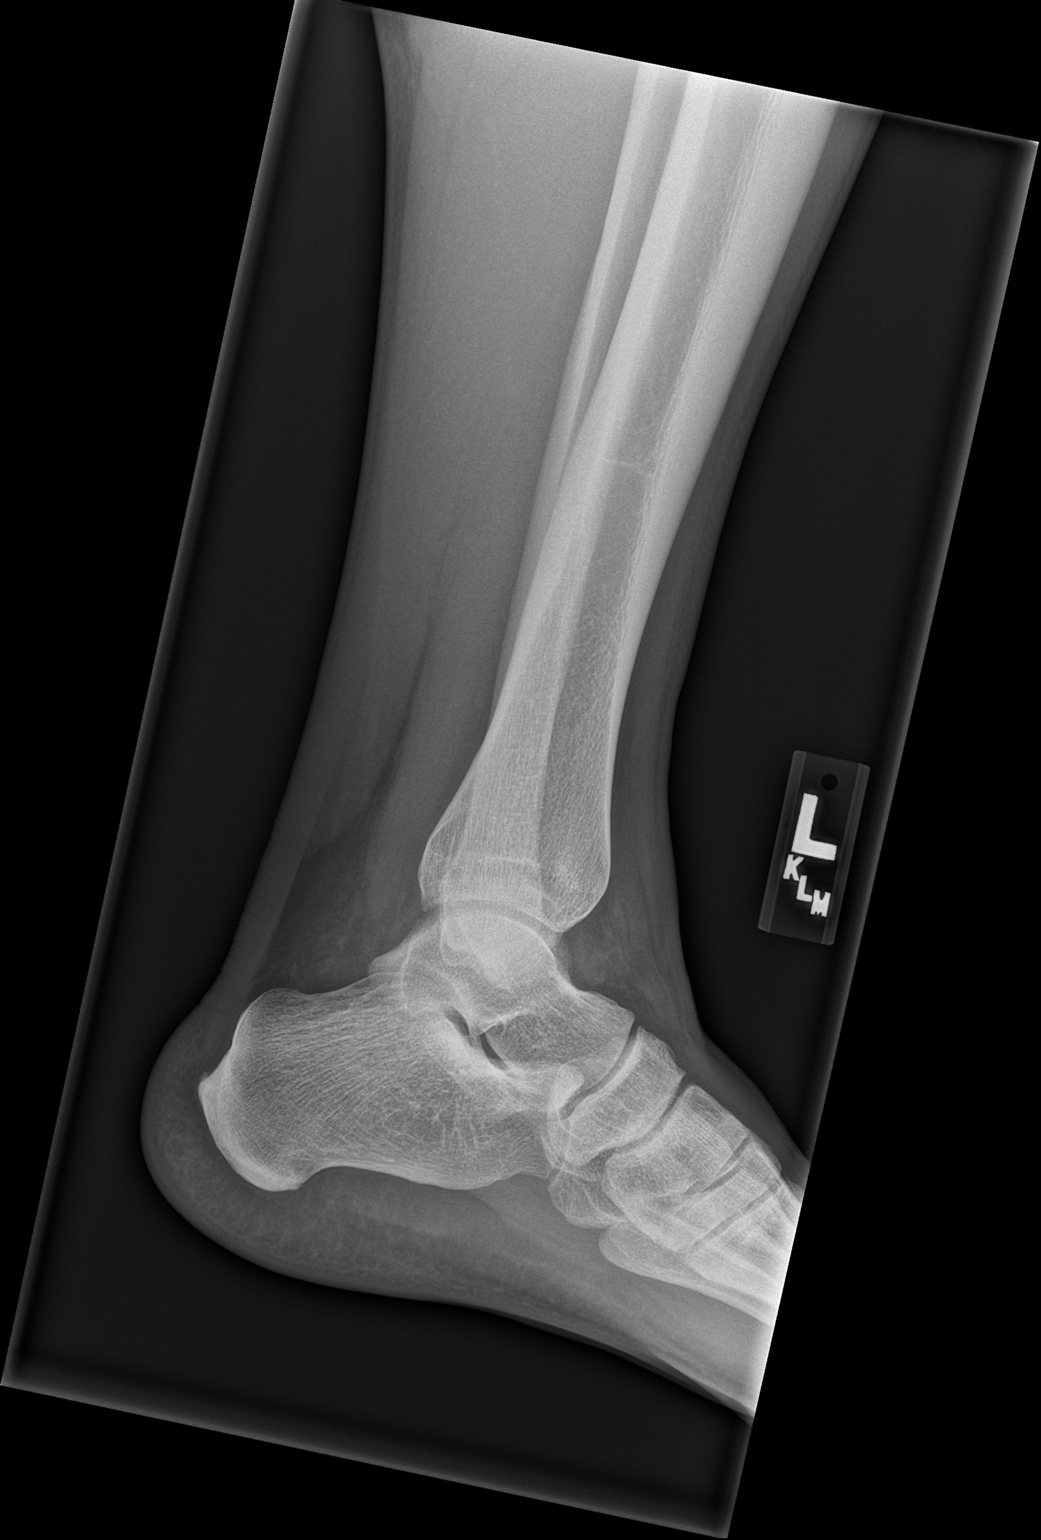

[3 of 3 positions shown; findings below may reference images not displayed]

FINDINGS: Frontal, oblique, and lateral views of the left ankle demonstrate no
fracture, subluxation, or dislocation. Mild anterior and lateral
soft tissue swelling. Joint spaces are well preserved.
IMPRESSION: 1. Soft tissue swelling, no acute fracture.

## 2020-10-24 ENCOUNTER — Encounter: Payer: Self-pay | Admitting: Physician Assistant

## 2020-10-24 ENCOUNTER — Other Ambulatory Visit (HOSPITAL_COMMUNITY): Payer: Self-pay

## 2020-10-24 ENCOUNTER — Other Ambulatory Visit: Payer: Self-pay

## 2020-10-24 ENCOUNTER — Ambulatory Visit (INDEPENDENT_AMBULATORY_CARE_PROVIDER_SITE_OTHER): Payer: No Typology Code available for payment source | Admitting: Physician Assistant

## 2020-10-24 VITALS — BP 129/88 | HR 66 | Temp 98.2°F | Ht 69.0 in | Wt 217.0 lb

## 2020-10-24 DIAGNOSIS — R0683 Snoring: Secondary | ICD-10-CM | POA: Diagnosis not present

## 2020-10-24 DIAGNOSIS — Z820 Family history of epilepsy and other diseases of the nervous system: Secondary | ICD-10-CM | POA: Diagnosis not present

## 2020-10-24 DIAGNOSIS — Z0000 Encounter for general adult medical examination without abnormal findings: Secondary | ICD-10-CM

## 2020-10-24 DIAGNOSIS — Z83438 Family history of other disorder of lipoprotein metabolism and other lipidemia: Secondary | ICD-10-CM | POA: Diagnosis not present

## 2020-10-24 DIAGNOSIS — B359 Dermatophytosis, unspecified: Secondary | ICD-10-CM

## 2020-10-24 DIAGNOSIS — E785 Hyperlipidemia, unspecified: Secondary | ICD-10-CM

## 2020-10-24 MED ORDER — TERBINAFINE HCL 250 MG PO TABS
250.0000 mg | ORAL_TABLET | Freq: Every day | ORAL | 0 refills | Status: DC
  Filled 2020-10-24: qty 14, 14d supply, fill #0

## 2020-10-24 NOTE — Progress Notes (Signed)
Established Patient Office Visit  Subjective:  Patient ID: John Barnes, male    DOB: [birth year]  Age: 35 y.o. MRN: [id number]  CC:  Chief Complaint  Patient presents with   Office Visit     HPI John Barnes presents to discuss snoring, rash and high cholesterol. Patient reports his wife states he snores aggressively. States does not feel tired during the day. Drinks 1 beer about 2-3 times per month. Denies nocturia. Does have a family history of sleep apnea (father). Also reports has recurrent fungal infection. Most recent started as athlete's foot and also has lesions under his left armpit and right groin area. Patient has tried OTC powder which helped athlete's foot but not other places. States in the past also tried oral diflucan and topical clotrimazole which were ineffective. Also fasting to repeat cholesterol which has been elevated in the past. States has a family history of elevated cholesterol (father).       History reviewed. No pertinent past medical history.  History reviewed. No pertinent surgical history.  Family History  Problem Relation Age of Onset   Diabetes Maternal Grandfather     Social History   Socioeconomic History   Marital status: Married    Spouse name: Not on file   Number of children: Not on file   Years of education: Not on file   Highest education level: Not on file  Occupational History   Not on file  Tobacco Use   Smoking status: Never   Smokeless tobacco: Never  Vaping Use   Vaping Use: Never used  Substance and Sexual Activity   Alcohol use: Yes    Comment: occ   Drug use: Never   Sexual activity: Yes  Other Topics Concern   Not on file  Social History Narrative   Not on file   Social Determinants of Health   Financial Resource Strain: Not on file  Food Insecurity: Not on file  Transportation Needs: Not on file  Physical Activity: Not on file  Stress: Not on file  Social Connections: Not on file  Intimate Partner  Violence: Not on file    Outpatient Medications Prior to Visit  Medication Sig Dispense Refill   clotrimazole (CLOTRIMAZOLE ANTI-FUNGAL) 1 % cream Apply 1 application topically 2 (two) times daily. 30 g 0   terbinafine (LAMISIL) 1 % cream Apply 1 application topically 2 (two) times daily.     fluconazole (DIFLUCAN) 150 MG tablet Take 1 tablet (150 mg total) by mouth once a week. Take once a week for 2-4 weeks 4 tablet 0   No facility-administered medications prior to visit.    No Known Allergies  ROS Review of Systems Review of Systems:  A fourteen system review of systems was performed and found to be positive as per HPI.   Objective:    Physical Exam General:  Well Developed, well nourished, appropriate for stated age.  Neuro:  Alert and oriented,  extra-ocular muscles intact  HEENT:  Normocephalic, atraumatic, neck supple Skin:  hyperpigmented area with few scaly patches underneath left axilla  Cardiac:  RRR, S1 S2 Respiratory: CTA B/L, Not using accessory muscles, speaking in full sentences- unlabored. Vascular:  Ext warm, no cyanosis apprec.; cap RF less 2 sec. Psych:  No HI/SI, judgement and insight good, Euthymic mood. Full Affect.  BP 129/88   Pulse 66   Temp 98.2 F (36.8 C)   Ht '5\' 9"'  (1.753 m)   Wt 217 lb (98.4 kg)  SpO2 98%   BMI 32.05 kg/m  Wt Readings from Last 3 Encounters:  10/24/20 217 lb (98.4 kg)  02/20/19 218 lb 11.2 oz (99.2 kg)  08/16/18 200 lb 4.8 oz (90.9 kg)     Health Maintenance Due  Topic Date Due   COVID-19 Vaccine (1) Never done   Hepatitis C Screening  Never done   INFLUENZA VACCINE  Never done    There are no preventive care reminders to display for this patient.  Lab Results  Component Value Date   TSH 2.300 10/24/2020   Lab Results  Component Value Date   WBC 6.4 10/24/2020   HGB 16.4 10/24/2020   HCT 48.7 10/24/2020   MCV 85 10/24/2020   PLT 288 10/24/2020   Lab Results  Component Value Date   NA 140 10/24/2020    K 4.6 10/24/2020   CO2 21 10/24/2020   GLUCOSE 83 10/24/2020   BUN 17 10/24/2020   CREATININE 0.92 10/24/2020   BILITOT 0.3 10/24/2020   ALKPHOS 67 10/24/2020   AST 23 10/24/2020   ALT 27 10/24/2020   PROT 7.2 10/24/2020   ALBUMIN 4.8 10/24/2020   CALCIUM 9.6 10/24/2020   EGFR 113 10/24/2020   Lab Results  Component Value Date   CHOL 223 (H) 10/24/2020   Lab Results  Component Value Date   HDL 25 (L) 10/24/2020   Lab Results  Component Value Date   LDLCALC 126 (H) 10/24/2020   Lab Results  Component Value Date   TRIG 402 (H) 10/24/2020   Lab Results  Component Value Date   CHOLHDL 8.9 (H) 10/24/2020   Lab Results  Component Value Date   HGBA1C 5.2 10/24/2020      Assessment & Plan:   Problem List Items Addressed This Visit       Other   Healthcare maintenance   Relevant Orders   Comp Met (CMET) (Completed)   CBC w/Diff (Completed)   TSH (Completed)   HgB A1c (Completed)   Lipid Profile (Completed)   Other Visit Diagnoses     Tinea    -  Primary   Relevant Medications   terbinafine (LAMISIL) 1 % cream   terbinafine (LAMISIL) 250 MG tablet   Family history of sleep apnea       Relevant Orders   PSG Sleep Study   Snoring       Relevant Orders   PSG Sleep Study   Family history of hyperlipidemia       Hyperlipidemia, unspecified hyperlipidemia type       Relevant Orders   Comp Met (CMET) (Completed)   CBC w/Diff (Completed)   HgB A1c (Completed)   Lipid Profile (Completed)      Snoring: -Patient completed Epworth Sleepiness Scale, score of 3 (normal) and STOP BANG Questionnaire answering yes to at least 3 items indicating high risk of OSA.  Patient has a family history of sleep apnea.  Will place order for sleep study.  Tinea: -Recurrent, failed topical anti-fungal treatments so recommend oral anti-fungal treatment with terbinafine 250 mg x 2 weeks. Discussed with patient extending treatment therapy to 4 weeks if symptoms fail to improve.  Patient verbalized understanding.  Hyperlipidemia: -Last lipid panel: Total cholesterol 213, triglycerides 276, HDL 27, LDL 131 -Discussed low-fat and carbohydrate diet. -Will repeat lipid panel and obtain fasting labs for CPE. Advised to schedule CPE.   Meds ordered this encounter  Medications   terbinafine (LAMISIL) 250 MG tablet    Sig: Take 1 tablet (  250 mg total) by mouth daily.    Dispense:  14 tablet    Refill:  0    Order Specific Question:   Supervising Provider    Answer:   Beatrice Lecher D [[id number]]     Follow-up: Return for CPE next available .    Lorrene Reid, PA-C

## 2020-10-24 NOTE — Patient Instructions (Signed)
Jock Itch ?Jock itch is an itchy rash in the groin and upper thigh area. It is a skin infection that is caused by a type of germ (fungus). The rash usually goes away in 2-3 weeks with treatment. ?What are the causes? ?This condition may be caused by: ?Touching a fungal infection elsewhere on your body, such as athlete's foot, and then touching your groin area. ?Sharing towels or clothing, such as socks or shoes, with someone who has a fungal infection. ?What increases the risk? ?This condition is most common in men and adolescent boys. You are also more likely to develop the condition if you: ?Are in a hot, humid climate. ?Wear tight-fitting clothes or wet bathing suits for a long time. ?Play sports. ?Are overweight. ?Have diabetes. ?Have a weakened body defense system (immune system). ?Sweat a lot. ?What are the signs or symptoms? ?Symptoms of this condition include: ?A red, pink, or brown rash in the groin area. ?There may be blisters. ?The rash may spread to your thighs, the opening between the butt (anus), and the butt. ?Dry and scaly skin on or around the rash. ?Itchiness. ?How is this treated? ?Treatment for this condition may include: ?Medicine to kill the fungus (antifungal). This may be one of the following: ?Skin cream. ?Ointment. ?Powder. ?Medicine that you take by mouth. ?Skin cream or ointment to reduce itching. ?Lifestyle changes, such as wearing looser clothing and caring for your skin. ?Follow these instructions at home: ?Skin care ?Use skin creams, ointments, or powders exactly as told by your doctor. ?Wear clothes that are loose. Clothes should not rub against your groin area. Men should wear boxer shorts or loose underwear. ?Keep your groin area clean and dry. ?Change your underwear every day. ?Change out of wet bathing suits as soon as you can. ?After bathing, use a different towel to dry your groin area. Dry the area gently and completely. ?Avoid hot baths and showers. Hot water can make itching  worse. ?Do not scratch the area. ?General instructions ?Take and apply over-the-counter and prescription medicines only as told by your doctor. ?Do not share towels or clothing with other people. ?Wash your hands often with soap and water for at least 20 seconds, especially after touching your groin area. If you cannot use soap and water, use hand sanitizer. ?When at the gym: ?Always wear shoes, especially in the shower and around the swimming pool. ?Keep any cuts covered. ?Clean any mats or equipment before using them. ?Shower as soon as you are done working out. ?Keep all follow-up visits. ?Contact a doctor if: ?Your rash: ?Gets worse. ?Does not get better after 2 weeks of treatment. ?Spreads. ?Comes back after treatment is done. ?You have a fever. ?You have new or more redness, swelling, or pain around your rash. ?You have fluid, blood, or pus coming from your rash. ?Summary ?Jock itch is an itchy rash. It affects the groin and upper thigh area. ?Jock itch usually goes away in 2-3 weeks with treatment. ?Keep your groin area clean and dry. ?This information is not intended to replace advice given to you by your health care provider. Make sure you discuss any questions you have with your health care provider. ?Document Revised: 03/26/2020 Document Reviewed: 03/26/2020 ?Elsevier Patient Education ? 2022 [employer] ? ?

## 2020-10-25 LAB — COMPREHENSIVE METABOLIC PANEL
ALT: 27 IU/L (ref 0–44)
AST: 23 IU/L (ref 0–40)
Albumin/Globulin Ratio: 2 (ref 1.2–2.2)
Albumin: 4.8 g/dL (ref 4.0–5.0)
Alkaline Phosphatase: 67 IU/L (ref 44–121)
BUN/Creatinine Ratio: 18 (ref 9–20)
BUN: 17 mg/dL (ref 6–20)
Bilirubin Total: 0.3 mg/dL (ref 0.0–1.2)
CO2: 21 mmol/L (ref 20–29)
Calcium: 9.6 mg/dL (ref 8.7–10.2)
Chloride: 102 mmol/L (ref 96–106)
Creatinine, Ser: 0.92 mg/dL (ref 0.76–1.27)
Globulin, Total: 2.4 g/dL (ref 1.5–4.5)
Glucose: 83 mg/dL (ref 70–99)
Potassium: 4.6 mmol/L (ref 3.5–5.2)
Sodium: 140 mmol/L (ref 134–144)
Total Protein: 7.2 g/dL (ref 6.0–8.5)
eGFR: 113 mL/min/{1.73_m2} (ref >59)

## 2020-10-25 LAB — HEMOGLOBIN A1C
Est. average glucose Bld gHb Est-mCnc: 103 mg/dL
Hgb A1c MFr Bld: 5.2 % (ref 4.8–5.6)

## 2020-10-25 LAB — CBC WITH DIFFERENTIAL/PLATELET
Basophils Absolute: 0 10*3/uL (ref 0.0–0.2)
Basos: 1 %
EOS (ABSOLUTE): 0.1 10*3/uL (ref 0.0–0.4)
Eos: 2 %
Hematocrit: 48.7 % (ref 37.5–51.0)
Hemoglobin: 16.4 g/dL (ref 13.0–17.7)
Immature Grans (Abs): 0 10*3/uL (ref 0.0–0.1)
Immature Granulocytes: 0 %
Lymphocytes Absolute: 1.8 10*3/uL (ref 0.7–3.1)
Lymphs: 29 %
MCH: 28.7 pg (ref 26.6–33.0)
MCHC: 33.7 g/dL (ref 31.5–35.7)
MCV: 85 fL (ref 79–97)
Monocytes Absolute: 0.4 10*3/uL (ref 0.1–0.9)
Monocytes: 7 %
Neutrophils Absolute: 3.9 10*3/uL (ref 1.4–7.0)
Neutrophils: 61 %
Platelets: 288 10*3/uL (ref 150–450)
RBC: 5.72 x10E6/uL (ref 4.14–5.80)
RDW: 13.1 % (ref 11.6–15.4)
WBC: 6.4 10*3/uL (ref 3.4–10.8)

## 2020-10-25 LAB — LIPID PANEL
Chol/HDL Ratio: 8.9 ratio — ABNORMAL HIGH (ref 0.0–5.0)
Cholesterol, Total: 223 mg/dL — ABNORMAL HIGH (ref 100–199)
HDL: 25 mg/dL — ABNORMAL LOW (ref >39)
LDL Chol Calc (NIH): 126 mg/dL — ABNORMAL HIGH (ref 0–99)
Triglycerides: 402 mg/dL — ABNORMAL HIGH (ref 0–149)
VLDL Cholesterol Cal: 72 mg/dL — ABNORMAL HIGH (ref 5–40)

## 2020-10-25 LAB — TSH: TSH: 2.3 u[IU]/mL (ref 0.450–4.500)

## 2020-11-04 ENCOUNTER — Other Ambulatory Visit: Payer: Self-pay | Admitting: Physician Assistant

## 2020-11-04 DIAGNOSIS — R0683 Snoring: Secondary | ICD-10-CM

## 2020-11-08 ENCOUNTER — Encounter: Payer: Self-pay | Admitting: Physician Assistant

## 2020-11-08 ENCOUNTER — Other Ambulatory Visit: Payer: Self-pay

## 2020-11-08 ENCOUNTER — Other Ambulatory Visit (HOSPITAL_COMMUNITY): Payer: Self-pay

## 2020-11-08 DIAGNOSIS — B359 Dermatophytosis, unspecified: Secondary | ICD-10-CM

## 2020-11-08 MED ORDER — TERBINAFINE HCL 250 MG PO TABS
250.0000 mg | ORAL_TABLET | Freq: Every day | ORAL | 0 refills | Status: DC
  Filled 2020-11-08: qty 14, 14d supply, fill #0

## 2020-11-26 ENCOUNTER — Encounter: Payer: Self-pay | Admitting: Physician Assistant

## 2020-11-26 ENCOUNTER — Ambulatory Visit (INDEPENDENT_AMBULATORY_CARE_PROVIDER_SITE_OTHER): Payer: No Typology Code available for payment source | Admitting: Physician Assistant

## 2020-11-26 ENCOUNTER — Other Ambulatory Visit: Payer: Self-pay

## 2020-11-26 ENCOUNTER — Other Ambulatory Visit: Payer: Self-pay | Admitting: Physician Assistant

## 2020-11-26 VITALS — BP 121/78 | HR 78 | Temp 98.0°F | Ht 69.0 in | Wt 219.0 lb

## 2020-11-26 DIAGNOSIS — E785 Hyperlipidemia, unspecified: Secondary | ICD-10-CM | POA: Diagnosis not present

## 2020-11-26 DIAGNOSIS — Z820 Family history of epilepsy and other diseases of the nervous system: Secondary | ICD-10-CM

## 2020-11-26 DIAGNOSIS — Z0000 Encounter for general adult medical examination without abnormal findings: Secondary | ICD-10-CM | POA: Diagnosis not present

## 2020-11-26 DIAGNOSIS — R0683 Snoring: Secondary | ICD-10-CM

## 2020-11-26 NOTE — Patient Instructions (Signed)
Preventive Care 21-33 Years Old, Male ?Preventive care refers to lifestyle choices and visits with your health care provider that can promote health and wellness. Preventive care visits are also called wellness exams. ?What can I expect for my preventive care visit? ?Counseling ?During your preventive care visit, your health care provider may ask about your: ?Medical history, including: ?Past medical problems. ?Family medical history. ?Current health, including: ?Emotional well-being. ?Home life and relationship well-being. ?Sexual activity. ?Lifestyle, including: ?Alcohol, nicotine or tobacco, and drug use. ?Access to firearms. ?Diet, exercise, and sleep habits. ?Safety issues such as seatbelt and bike helmet use. ?Sunscreen use. ?Work and work environment. ?Physical exam ?Your health care provider may check your: ?Height and weight. These may be used to calculate your BMI (body mass index). BMI is a measurement that tells if you are at a healthy weight. ?Waist circumference. This measures the distance around your waistline. This measurement also tells if you are at a healthy weight and may help predict your risk of certain diseases, such as type 2 diabetes and high blood pressure. ?Heart rate and blood pressure. ?Body temperature. ?Skin for abnormal spots. ?What immunizations do I need? ?Vaccines are usually given at various ages, according to a schedule. Your health care provider will recommend vaccines for you based on your age, medical history, and lifestyle or other factors, such as travel or where you work. ?What tests do I need? ?Screening ?Your health care provider may recommend screening tests for certain conditions. This may include: ?Lipid and cholesterol levels. ?Diabetes screening. This is done by checking your blood sugar (glucose) after you have not eaten for a while (fasting). ?Hepatitis B test. ?Hepatitis C test. ?HIV (human immunodeficiency virus) test. ?STI (sexually transmitted infection)  testing, if you are at risk. ?Talk with your health care provider about your test results, treatment options, and if necessary, the need for more tests. ?Follow these instructions at home: ?Eating and drinking ? ?Eat a healthy diet that includes fresh fruits and vegetables, whole grains, lean protein, and low-fat dairy products. ?Drink enough fluid to keep your urine pale yellow. ?Take vitamin and mineral supplements as recommended by your health care provider. ?Do not drink alcohol if your health care provider tells you not to drink. ?If you drink alcohol: ?Limit how much you have to 0-2 drinks a day. ?Know how much alcohol is in your drink. In the [place] one drink equals one 12 oz bottle of beer (355 mL), one 5 oz glass of wine (148 mL), or one 1? oz glass of hard liquor (44 mL). ?Lifestyle ?Brush your teeth every morning and night with fluoride toothpaste. Floss one time each day. ?Exercise for at least 30 minutes 5 or more days each week. ?Do not use any products that contain nicotine or tobacco. These products include cigarettes, chewing tobacco, and vaping devices, such as e-cigarettes. If you need help quitting, ask your health care provider. ?Do not use drugs. ?If you are sexually active, practice safe sex. Use a condom or other form of protection to prevent STIs. ?Find healthy ways to manage stress, such as: ?Meditation, yoga, or listening to music. ?Journaling. ?Talking to a trusted person. ?Spending time with friends and family. ?Minimize exposure to UV radiation to reduce your risk of skin cancer. ?Safety ?Always wear your seat belt while driving or riding in a vehicle. ?Do not drive: ?If you have been drinking alcohol. Do not ride with someone who has been drinking. ?If you have been using any mind-altering substances or   drugs. ?While texting. ?When you are tired or distracted. ?Wear a helmet and other protective equipment during sports activities. ?If you have firearms in your house, make sure you  follow all gun safety procedures. ?Seek help if you have been physically or sexually abused. ?What's next? ?Go to your health care provider once a year for an annual wellness visit. ?Ask your health care provider how often you should have your eyes and teeth checked. ?Stay up to date on all vaccines. ?This information is not intended to replace advice given to you by your health care provider. Make sure you discuss any questions you have with your health care provider. ?Document Revised: 07/03/2020 Document Reviewed: 07/03/2020 ?Elsevier Patient Education ? 2022 Elsevier Inc. ? ?

## 2020-11-26 NOTE — Progress Notes (Signed)
Male physical   Impression and Recommendations:    1. Healthcare maintenance   2. Hyperlipidemia, unspecified hyperlipidemia type      1) Anticipatory Guidance: Skin CA prevention: recommend to use sunscreen when outside along with skin surveillance; eating a balanced and modest diet; physical activity at least 25 minutes per day or minimum of 150 min/ week moderate to intense activity.  2) Immunizations / Screenings / Labs:   All immunizations are up-to-date per recommendations or will be updated today if pt allows.    - Patient understands with dental and vision screens they will schedule independently.  -UTD on fasting blood work and Tdap.  3) Healthcare Maintenance: -Discussed heart healthy/low carb diet such as Mediterranean diet. -Recommend to increase physical activity. -Stay well hydrated. -Follow up in 1 year for CPE and FBW. Lab visit in 6 months to repeat fasting lipid panel.     No orders of the defined types were placed in this encounter.   No orders of the defined types were placed in this encounter.    Return in about 1 year (around 11/26/2021) for CPE and FBW; lab visit in 6 months for fasting lipid panel .    Gross side effects, risk and benefits, and alternatives of medications discussed with patient.  Patient is aware that all medications have potential side effects and we are unable to predict every side effect or drug-drug interaction that may occur.  Expresses verbal understanding and consents to current therapy plan and treatment regimen.  Please see AVS handed out to patient at the end of our visit for further patient instructions/ counseling done pertaining to today's office visit.       Subjective:        CC: CPE   HPI: John Barnes is a 34 y.o. male who presents to [facility] today for a yearly health maintenance exam.     Health Maintenance Summary  - Reviewed and updated, unless pt declines  services.  Last Cologuard or Colonoscopy:   n/a Family history of Colon CA:  no Tobacco History Reviewed:   yes, never smoker Alcohol / drug use:    No concerns, socially drinks, no use Dental Home:  y Eye exams:y Male history: STD concerns:   none Additional penile/ urinary concerns:   none   Additional concerns beyond Health Maintenance issues:  none     Immunization History  Administered Date(s) Administered   Tdap 08/09/2017     Health Maintenance  Topic Date Due   COVID-19 Vaccine (1) 12/12/2020 (Originally 11/02/1985)   INFLUENZA VACCINE  04/18/2021 (Originally 08/19/2020)   Hepatitis C Screening  11/26/2021 (Originally 05/02/2005)   TETANUS/TDAP  08/10/2027   HIV Screening  Completed   Pneumococcal Vaccine 96-23 Years old  Aged Out   HPV VACCINES  Aged Out       Wt Readings from Last 3 Encounters:  11/26/20 219 lb (99.3 kg)  10/24/20 217 lb (98.4 kg)  02/20/19 218 lb 11.2 oz (99.2 kg)   BP Readings from Last 3 Encounters:  11/26/20 121/78  10/24/20 129/88  02/20/19 125/74   Pulse Readings from Last 3 Encounters:  11/26/20 78  10/24/20 66  02/20/19 72    Patient Active Problem List   Diagnosis Date Noted   Left ankle sprain 02/20/2019   Elevated LDL cholesterol level 08/09/2017   Smokeless tobacco use 08/09/2017   Healthcare maintenance 05/13/2017   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 No pertinent past medical history.  History reviewed. No pertinent surgical history.  Family History  Problem Relation Age of Onset   Diabetes Maternal Grandfather     Social History   Substance and Sexual Activity  Drug Use Never  ,  Social History   Substance and Sexual Activity  Alcohol Use Yes   Comment: occ  ,  Social History   Tobacco Use  Smoking Status Never  Smokeless Tobacco Never  ,  Social History   Substance and Sexual Activity  Sexual Activity Yes    Patient's Medications  New Prescriptions   No medications on file   Previous Medications   CLOTRIMAZOLE (CLOTRIMAZOLE ANTI-FUNGAL) 1 % CREAM    Apply 1 application topically 2 (two) times daily.   TERBINAFINE (LAMISIL) 1 % CREAM    Apply 1 application topically 2 (two) times daily.   TERBINAFINE (LAMISIL) 250 MG TABLET    Take 1 tablet by mouth daily.  Modified Medications   No medications on file  Discontinued Medications   No medications on file    Patient has no known allergies.  Review of Systems: General:   Denies fever, chills, unexplained weight loss.  Optho/Auditory:   Denies visual changes, blurred vision/LOV Respiratory:   Denies SOB, DOE more than baseline levels.   Cardiovascular:   Denies chest pain, palpitations, new onset peripheral edema  Gastrointestinal:   Denies nausea, vomiting, diarrhea.  Genitourinary: Denies dysuria, freq/ urgency, flank pain or discharge from genitals.  Endocrine:     Denies hot or cold intolerance, polyuria, polydipsia. Musculoskeletal:   Denies unexplained myalgias, joint swelling, unexplained arthralgias, gait problems.  Skin:  Denies rash, suspicious lesions Neurological:     Denies dizziness, unexplained weakness, numbness  Psychiatric/Behavioral:   Denies mood changes, suicidal or homicidal ideations, hallucinations    Objective:     Blood pressure 121/78, pulse 78, temperature 98 F (36.7 C), height 5\' 9"  (1.753 m), weight 219 lb (99.3 kg), SpO2 98 %. Body mass index is 32.34 kg/m. General Appearance:    Alert, cooperative, no distress, appears stated age  Head:    Normocephalic, without obvious abnormality, atraumatic  Eyes:    PERRL, conjunctiva/corneas clear, EOM's intact, fundi    benign, both eyes  Ears:    Normal TM's and external ear canals, both ears  Nose:   Nares normal, septum midline, mucosa normal, no drainage    or sinus tenderness  Throat:   Lips w/o lesion, mucosa moist, and tongue normal; teeth and gums normal  Neck:   Supple, symmetrical, trachea midline, no adenopathy;     thyroid:  no enlargement/tenderness/nodules; no JVD  Back:     Symmetric, no curvature, ROM normal, no CVA tenderness  Lungs:     Clear to auscultation bilaterally, respirations unlabored, no Wh/ R/ R  Chest Wall:    No tenderness or gross deformity; normal excursion   Heart:    Regular rate and rhythm, S1 and S2 normal, no murmur, rub   or gallop  Abdomen:     Soft, non-tender, bowel sounds active all four quadrants, No G/R/R, no masses, no organomegaly  Genitalia:   Deferred  Rectal:   Deferred.   Extremities:   Extremities normal, atraumatic, no cyanosis or gross edema  Pulses:   2+ and symmetric all extremities  Skin:   Warm, dry, Skin color, texture, turgor normal, no obvious rashes or lesions  M-Sk:   Ambulates * 4 w/o difficulty, no gross deformities, tone WNL  Neurologic:   CNII-XII grossly ntact Psych:  No HI/SI, judgement and insight good, Euthymic mood. Full Affect.

## 2021-01-01 ENCOUNTER — Other Ambulatory Visit: Payer: Self-pay

## 2021-01-01 ENCOUNTER — Ambulatory Visit (HOSPITAL_BASED_OUTPATIENT_CLINIC_OR_DEPARTMENT_OTHER): Payer: No Typology Code available for payment source | Attending: Physician Assistant | Admitting: Internal Medicine

## 2021-01-01 VITALS — Ht 70.0 in | Wt 218.0 lb

## 2021-01-01 DIAGNOSIS — G4731 Primary central sleep apnea: Secondary | ICD-10-CM | POA: Diagnosis not present

## 2021-01-01 DIAGNOSIS — G4733 Obstructive sleep apnea (adult) (pediatric): Secondary | ICD-10-CM | POA: Insufficient documentation

## 2021-01-01 DIAGNOSIS — R0683 Snoring: Secondary | ICD-10-CM | POA: Insufficient documentation

## 2021-01-05 DIAGNOSIS — R0683 Snoring: Secondary | ICD-10-CM | POA: Diagnosis not present

## 2021-01-05 NOTE — Procedures (Signed)
° °  Patient Name: John Barnes, Lemen Date: 01/01/2021 Gender: Male D.O.B: 10-14-1985 Age (years): 35 Referring Provider: Mayer Masker PA-C Height (inches): 70 Interpreting Physician: Jetty Duhamel MD, ABSM Weight (lbs): 218 RPSGT: Armen Pickup BMI: 31 MRN: [id number]  CLINICAL INFORMATION Sleep Study Type: NPSG Indication for sleep study: Non-refreshing Sleep, Snoring, Witnesses Apnea / Gasping During Sleep Epworth Sleepiness Score: 2  SLEEP STUDY TECHNIQUE As per the AASM Manual for the Scoring of Sleep and Associated Events v2.3 (April 2016) with a hypopnea requiring 4% desaturations.  The channels recorded and monitored were frontal, central and occipital EEG, electrooculogram (EOG), submentalis EMG (chin), nasal and oral airflow, thoracic and abdominal wall motion, anterior tibialis EMG, snore microphone, electrocardiogram, and pulse oximetry.  MEDICATIONS Medications self-administered by patient taken the night of the study : none reported  SLEEP ARCHITECTURE The study was initiated at 10:08:07 PM and ended at 4:36:45 AM.  Sleep onset time was 23.0 minutes and the sleep efficiency was 65.4%. The total sleep time was 254 minutes.  Stage REM latency was 164.5 minutes.  The patient spent 48.62% of the night in stage N1 sleep, 28.94% in stage N2 sleep, 10.83% in stage N3 and 11.6% in REM.  Alpha intrusion was absent.  Supine sleep was 68.84%.  RESPIRATORY PARAMETERS The overall apnea/hypopnea index (AHI) was 36.6 per hour. There were 84 total apneas, including 31 obstructive, 53 central and 0 mixed apneas. There were 71 hypopneas and 36 RERAs.  The AHI during Stage REM sleep was 20.3 per hour.  AHI while supine was 43.9 per hour.  The mean oxygen saturation was 92.40%. The minimum SpO2 during sleep was 80.00%.  moderate snoring was noted during this study.  CARDIAC DATA The 2 lead EKG demonstrated sinus rhythm. The mean heart rate was 75.02 beats per minute.  Other EKG findings include: None.  LEG MOVEMENT DATA The total PLMS were 0 with a resulting PLMS index of 0.00. Associated arousal with leg movement index was 0.0 .  IMPRESSIONS - Severe obstructive sleep apnea occurred during this study (AHI = 36.6/h). - Mild central sleep apnea occurred during this study (CAI = 12.5/h). - Moderate oxygen desaturation was noted during this study (Min O2 = 80.00%). Mean O2 saturation 92.8%. - The patient snored with moderate snoring volume. - No cardiac abnormalities were noted during this study. - Clinically significant periodic limb movements did not occur during sleep. No significant associated arousals.  DIAGNOSIS - Obstructive Sleep Apnea (G47.33) - Central Sleep Apnea (G47.37)  RECOMMENDATIONS - Suggest CPAP titration in-lab sleep study. This will establish therapeutic pressures and also identify if there is significant residual Central apnea. - Be careful with alcohol, sedatives and other CNS depressants that may worsen sleep apnea and disrupt normal sleep architecture. - Sleep hygiene should be reviewed to assess factors that may improve sleep quality. - Weight management and regular exercise should be initiated or continued if appropriate.  [Electronically signed] 01/05/2021 12:34 PM  Jetty Duhamel MD, ABSM Diplomate, American Board of Sleep Medicine   NPI: [id number]                          Jetty Duhamel Diplomate, American Board of Sleep Medicine  ELECTRONICALLY SIGNED ON:  01/05/2021, 12:27 PM [facility] PH: [phone]   FX: [phone] ACCREDITED BY THE AMERICAN ACADEMY OF SLEEP MEDICINE

## 2021-01-07 ENCOUNTER — Encounter: Payer: Self-pay | Admitting: Physician Assistant

## 2021-01-07 DIAGNOSIS — R0683 Snoring: Secondary | ICD-10-CM

## 2021-01-21 NOTE — Telephone Encounter (Signed)
The order has been placed. AS, CMA

## 2021-01-28 ENCOUNTER — Encounter: Payer: Self-pay | Admitting: Physician Assistant

## 2021-03-06 ENCOUNTER — Ambulatory Visit (HOSPITAL_BASED_OUTPATIENT_CLINIC_OR_DEPARTMENT_OTHER): Payer: No Typology Code available for payment source | Attending: Physician Assistant | Admitting: Internal Medicine

## 2021-03-06 ENCOUNTER — Other Ambulatory Visit: Payer: Self-pay

## 2021-03-06 DIAGNOSIS — R0683 Snoring: Secondary | ICD-10-CM | POA: Diagnosis present

## 2021-03-06 DIAGNOSIS — G4733 Obstructive sleep apnea (adult) (pediatric): Secondary | ICD-10-CM | POA: Insufficient documentation

## 2021-03-09 DIAGNOSIS — R0683 Snoring: Secondary | ICD-10-CM | POA: Diagnosis not present

## 2021-03-09 NOTE — Procedures (Signed)
° °  Patient Name: John Barnes, Shepard Date: 03/06/2021 Gender: Male D.O.B: 16-Apr-1985 Age (years): 35 Referring Provider: Mayer Masker PA-C Height (inches): 70 Interpreting Physician: Jetty Duhamel MD, ABSM Weight (lbs): 218 RPSGT: Cherylann Parr BMI: 31 MRN: [id number] Neck Size: 17.00  CLINICAL INFORMATION The patient is referred for a CPAP titration to treat sleep apnea.  Date of NPSG, Split Night or HST: NPSG 01/01/21  AHI 36.6/ hr, desaturation to 80%, body weight 218 lbs  SLEEP STUDY TECHNIQUE As per the AASM Manual for the Scoring of Sleep and Associated Events v2.3 (April 2016) with a hypopnea requiring 4% desaturations.  The channels recorded and monitored were frontal, central and occipital EEG, electrooculogram (EOG), submentalis EMG (chin), nasal and oral airflow, thoracic and abdominal wall motion, anterior tibialis EMG, snore microphone, electrocardiogram, and pulse oximetry. Continuous positive airway pressure (CPAP) was initiated at the beginning of the study and titrated to treat sleep-disordered breathing.  MEDICATIONS Medications self-administered by patient taken the night of the study : none reported  TECHNICIAN COMMENTS Comments added by technician: Patient had difficulty initiating sleep. Comments added by scorer: N/A  RESPIRATORY PARAMETERS Optimal PAP Pressure (cm): 14 AHI at Optimal Pressure (/hr): 1.1 Overall Minimal O2 (%): 85.0 Supine % at Optimal Pressure (%): 53 Minimal O2 at Optimal Pressure (%): 89.0   SLEEP ARCHITECTURE The study was initiated at 10:37:37 PM and ended at 5:08:48 AM.  Sleep onset time was 31.7 minutes and the sleep efficiency was 87.6%%. The total sleep time was 342.5 minutes.  The patient spent 2.8%% of the night in stage N1 sleep, 56.5%% in stage N2 sleep, 18.4%% in stage N3 and 22.3% in REM.Stage REM latency was 88.0 minutes  Wake after sleep onset was 17.0. Alpha intrusion was absent. Supine sleep was 50.56%.  CARDIAC  DATA The 2 lead EKG demonstrated sinus rhythm. The mean heart rate was 64.8 beats per minute. Other EKG findings include: None.  LEG MOVEMENT DATA The total Periodic Limb Movements of Sleep (PLMS) were 0. The PLMS index was 0.0. A PLMS index of <15 is considered normal in adults.  IMPRESSIONS - The optimal PAP pressure was 14 cm of water. - Moderate oxygen desaturations were observed during this titration (min O2 = 85.0%). Mean O2 saturation on CPAP 14 was 94.6%. - No snoring was audible during this study. - No cardiac abnormalities were observed during this study. - Clinically significant periodic limb movements were not noted during this study.  DIAGNOSIS - Obstructive Sleep Apnea (G47.33)  RECOMMENDATIONS - Trial of CPAP therapy on 14 cm H2O or autopap 10-20. - Patient used a Medium size Fisher[employer]Paykel Full Face Mask Simplus mask and heated humidification. - Be careful with alcohol, sedatives and other CNS depressants that may worsen sleep apnea and disrupt normal sleep architecture. - Sleep hygiene should be reviewed to assess factors that may improve sleep quality. - Weight management and regular exercise should be initiated or continued.  [Electronically signed] 03/09/2021 12:19 PM  Jetty Duhamel MD, ABSM Diplomate, American Board of Sleep Medicine   NPI: [id number]                          Jetty Duhamel Diplomate, American Board of Sleep Medicine  ELECTRONICALLY SIGNED ON:  03/09/2021, 12:23 PM [facility] PH: [phone]   FX: [phone] ACCREDITED BY THE AMERICAN ACADEMY OF SLEEP MEDICINE

## 2021-03-24 ENCOUNTER — Encounter: Payer: Self-pay | Admitting: Physician Assistant

## 2021-03-24 DIAGNOSIS — G4730 Sleep apnea, unspecified: Secondary | ICD-10-CM

## 2021-03-24 NOTE — Telephone Encounter (Signed)
I reviewed the sleep study results. I have not done ordering for CPAP and supplies here yet. How do we do them? Just put them in epic for DME supplies? And recommendations for CPAP pressure?

## 2021-04-03 ENCOUNTER — Telehealth: Payer: Self-pay | Admitting: Physician Assistant

## 2021-04-03 NOTE — Telephone Encounter (Signed)
Patient called to let you know he had taken the order for his CPAP to [facility] and they would not take it that way. They said that it needs to be ordered through our office via fax of order, office notes, etc. Can you please call [employer] at [phone]? ?

## 2021-04-04 ENCOUNTER — Other Ambulatory Visit: Payer: Self-pay

## 2021-04-04 DIAGNOSIS — G4730 Sleep apnea, unspecified: Secondary | ICD-10-CM

## 2021-04-04 NOTE — Telephone Encounter (Signed)
Faxed over office notes, sleep study notes, insurance card, and patient demographics to [employer] with [employer]. Patient was contacted and left a voicemail letting him know.  ? ?Social research officer, [employer] ?[address] ?[place], [place] [address] ?Phone: [phone] ?

## 2021-04-10 ENCOUNTER — Other Ambulatory Visit: Payer: Self-pay

## 2021-04-10 DIAGNOSIS — G4730 Sleep apnea, unspecified: Secondary | ICD-10-CM

## 2021-04-28 ENCOUNTER — Telehealth: Payer: Self-pay | Admitting: Physician Assistant

## 2021-04-28 NOTE — Telephone Encounter (Signed)
Spoke with patient and he wanted verbal ok to change setting. Sleep study said setting can be adjusted from 10-14. Advised the patient he could change the setting so he can keep seal on mask throughout night.  ?

## 2021-04-28 NOTE — Telephone Encounter (Signed)
Patient had left a vm while we were not in office on 04/07 and stated he needs this cpap pressure lowered and the company will not lower it until they hear from you. Please advise. [phone] ?

## 2021-05-26 ENCOUNTER — Other Ambulatory Visit: Payer: No Typology Code available for payment source

## 2021-05-26 ENCOUNTER — Other Ambulatory Visit (HOSPITAL_COMMUNITY): Payer: Self-pay

## 2021-05-26 ENCOUNTER — Encounter: Payer: Self-pay | Admitting: Physician Assistant

## 2021-05-26 ENCOUNTER — Ambulatory Visit (INDEPENDENT_AMBULATORY_CARE_PROVIDER_SITE_OTHER): Payer: No Typology Code available for payment source | Admitting: Physician Assistant

## 2021-05-26 VITALS — BP 112/73 | HR 67 | Temp 97.7°F | Ht 69.0 in | Wt 212.0 lb

## 2021-05-26 DIAGNOSIS — G4733 Obstructive sleep apnea (adult) (pediatric): Secondary | ICD-10-CM

## 2021-05-26 DIAGNOSIS — E785 Hyperlipidemia, unspecified: Secondary | ICD-10-CM | POA: Diagnosis not present

## 2021-05-26 DIAGNOSIS — R21 Rash and other nonspecific skin eruption: Secondary | ICD-10-CM | POA: Diagnosis not present

## 2021-05-26 DIAGNOSIS — B359 Dermatophytosis, unspecified: Secondary | ICD-10-CM | POA: Diagnosis not present

## 2021-05-26 MED ORDER — KETOCONAZOLE 2 % EX CREA
1.0000 "application " | TOPICAL_CREAM | Freq: Every day | CUTANEOUS | 0 refills | Status: DC
  Filled 2021-05-26: qty 15, 15d supply, fill #0

## 2021-05-26 NOTE — Progress Notes (Signed)
?Established patient visit ? ? ?Patient: John Barnes   DOB: 04-12-1985   36 y.o. Male  MRN: [id number] ?Visit Date: 05/26/2021 ? ?No chief complaint on file. ? ?Subjective  ?  ?HPI  ?Patient presents for follow-up on OSA. Reports uses CPAP machine 3-7 hours every night. At the beginning he had too much trouble with too much air blowing out of his mask. States lowered setting to 10 cm. Reports having trouble with getting his supplies. States his wife has noticed no snoring. Also states continues to has rash which was better and then seemed to get worse. Patient is fasting for repeat lipid panel.  ? ? ?Medications: ?Outpatient Medications Prior to Visit  ?Medication Sig  ? terbinafine (LAMISIL) 250 MG tablet Take 1 tablet by mouth daily. (Patient not taking: Reported on 05/26/2021)  ? [DISCONTINUED] clotrimazole (CLOTRIMAZOLE ANTI-FUNGAL) 1 % cream Apply 1 application topically 2 (two) times daily. (Patient not taking: Reported on 05/26/2021)  ? [DISCONTINUED] terbinafine (LAMISIL) 1 % cream Apply 1 application topically 2 (two) times daily. (Patient not taking: Reported on 05/26/2021)  ? ?No facility-administered medications prior to visit.  ? ? ?Review of Systems ?Review of Systems:  ?A fourteen system review of systems was performed and found to be positive as per HPI. ? ? ?hem  Endocrine  Serology  Results Review (optional):[id number]} ?  Objective  ?  ?BP 112/73   Pulse 67   Temp 97.7 ?F (36.5 ?C)   Ht 5\' 9"  (1.753 m)   Wt 212 lb (96.2 kg)   SpO2 98%   BMI 31.31 kg/m?  ?BP Readings from Last 3 Encounters:  ?05/26/21 112/73  ?11/26/20 121/78  ?10/24/20 129/88  ? ?Wt Readings from Last 3 Encounters:  ?05/26/21 212 lb (96.2 kg)  ?03/06/21 218 lb (98.9 kg)  ?01/01/21 218 lb (98.9 kg)  ? ? ?Physical Exam  ?General:  Well Developed, well nourished, appropriate for stated age.  ?Neuro:  Alert and oriented,  extra-ocular muscles intact  ?HEENT:  Normocephalic, atraumatic, neck supple  ?Skin:  warm, pink.  ?Cardiac:   RRR, S1 S2 ?Respiratory: CTA B/L  ?Vascular:  Ext warm, no cyanosis apprec.; cap RF less 2 sec. ?Psych:  No HI/SI, judgement and insight good, Euthymic mood. Full Affect. ? ?No results found for any visits on 05/26/21. ? Assessment & Plan  ?  ? ? ?Problem List Items Addressed This Visit   ?None ?Visit Diagnoses   ? ? OSA (obstructive sleep apnea)    -  Primary  ? Rash      ? Tinea      ? Relevant Medications  ? ketoconazole (NIZORAL) 2 % cream  ? Other Relevant Orders  ? Ambulatory referral to Dermatology  ? Hyperlipidemia, unspecified hyperlipidemia type      ? Relevant Orders  ? Lipid Profile  ? ?  ? ?OSA:  ?-Continue with CPAP use, at least 4 hours every night for compliance.  ?-Advised if having issues with getting supplies to contact insurance and inquire alternative preferred medical supply company.  ?-Will continue to monitor. ? ?Tinea: ?-Patient has been treated for recurrent tinea infection with oral terbinafine and has tried several anti-fungal creams including terbinafine and clotrimazole. Will send rx for ketoconazole 2 % to use daily as needed and send referral to dermatology.  ? ?Return in about 6 months (around 11/26/2021) for CPE and FBW.  ?   ? ? ? ?13/08/2021, PA-C  ? Primary [facility] at [facility] ?[phone] (phone) ?  [phone] (fax) ? ?[facility] ?

## 2021-05-26 NOTE — Patient Instructions (Signed)

## 2021-05-27 LAB — LIPID PANEL
Chol/HDL Ratio: 8.8 ratio — ABNORMAL HIGH (ref 0.0–5.0)
Cholesterol, Total: 202 mg/dL — ABNORMAL HIGH (ref 100–199)
HDL: 23 mg/dL — ABNORMAL LOW (ref >39)
LDL Chol Calc (NIH): 127 mg/dL — ABNORMAL HIGH (ref 0–99)
Triglycerides: 288 mg/dL — ABNORMAL HIGH (ref 0–149)
VLDL Cholesterol Cal: 52 mg/dL — ABNORMAL HIGH (ref 5–40)

## 2021-07-18 ENCOUNTER — Encounter: Payer: Self-pay | Admitting: Physician Assistant

## 2021-11-20 ENCOUNTER — Emergency Department (HOSPITAL_COMMUNITY): Payer: No Typology Code available for payment source

## 2021-11-20 ENCOUNTER — Other Ambulatory Visit: Payer: Self-pay

## 2021-11-20 ENCOUNTER — Emergency Department (HOSPITAL_COMMUNITY)
Admission: EM | Admit: 2021-11-20 | Discharge: 2021-11-20 | Disposition: A | Discharge status: Home / Self Care | Payer: No Typology Code available for payment source | Attending: Emergency Medicine | Admitting: Emergency Medicine

## 2021-11-20 ENCOUNTER — Encounter (HOSPITAL_COMMUNITY): Payer: Self-pay

## 2021-11-20 DIAGNOSIS — H538 Other visual disturbances: Secondary | ICD-10-CM | POA: Insufficient documentation

## 2021-11-20 DIAGNOSIS — D72829 Elevated white blood cell count, unspecified: Secondary | ICD-10-CM | POA: Diagnosis not present

## 2021-11-20 DIAGNOSIS — R519 Headache, unspecified: Secondary | ICD-10-CM | POA: Insufficient documentation

## 2021-11-20 DIAGNOSIS — R110 Nausea: Secondary | ICD-10-CM | POA: Insufficient documentation

## 2021-11-20 LAB — URINALYSIS, ROUTINE W REFLEX MICROSCOPIC
Bilirubin Urine: NEGATIVE
Glucose, UA: NEGATIVE mg/dL
Hgb urine dipstick: NEGATIVE
Ketones, ur: 20 mg/dL — AB
Leukocytes,Ua: NEGATIVE
Nitrite: NEGATIVE
Protein, ur: 30 mg/dL — AB
Specific Gravity, Urine: 1.027 (ref 1.005–1.030)
pH: 5 (ref 5.0–8.0)

## 2021-11-20 LAB — CBC
HCT: 48.3 % (ref 39.0–52.0)
Hemoglobin: 16.1 g/dL (ref 13.0–17.0)
MCH: 28.6 pg (ref 26.0–34.0)
MCHC: 33.3 g/dL (ref 30.0–36.0)
MCV: 85.8 fL (ref 80.0–100.0)
Platelets: 329 10*3/uL (ref 150–400)
RBC: 5.63 MIL/uL (ref 4.22–5.81)
RDW: 13.1 % (ref 11.5–15.5)
WBC: 12.9 10*3/uL — ABNORMAL HIGH (ref 4.0–10.5)
nRBC: 0 % (ref 0.0–0.2)

## 2021-11-20 LAB — BASIC METABOLIC PANEL
Anion gap: 9 (ref 5–15)
BUN: 17 mg/dL (ref 6–20)
CO2: 24 mmol/L (ref 22–32)
Calcium: 9.9 mg/dL (ref 8.9–10.3)
Chloride: 104 mmol/L (ref 98–111)
Creatinine, Ser: 0.91 mg/dL (ref 0.61–1.24)
GFR, Estimated: >60 mL/min (ref >60)
Glucose, Bld: 97 mg/dL (ref 70–99)
Potassium: 4.3 mmol/L (ref 3.5–5.1)
Sodium: 137 mmol/L (ref 135–145)

## 2021-11-20 LAB — CBG MONITORING, ED: Glucose-Capillary: 107 mg/dL — ABNORMAL HIGH (ref 70–99)

## 2021-11-20 MED ORDER — ACETAMINOPHEN 500 MG PO TABS
1000.0000 mg | ORAL_TABLET | Freq: Once | ORAL | Status: AC
  Administered 2021-11-20: 1000 mg via ORAL
  Filled 2021-11-20: qty 2

## 2021-11-20 NOTE — ED Triage Notes (Signed)
Pt c/o blurry vision that started at 12:30 today lasting approx 1 hour, followed by an intense headache starting at approx 13:30 and still going on currently. Pt c/o nausea earlier, but not at this time. Pt has symmetrical smile, able to follow commands, bilateral hand grips equal. Pt denies blurry vision at this time.

## 2021-11-20 NOTE — ED Notes (Signed)
Domenic Moras, PA aware of patients complaints.

## 2021-11-20 NOTE — ED Provider Notes (Signed)
[facility] DEPT Provider Note   CSN: [id number] Arrival date & time: 11/20/21  1639     History  Chief Complaint  Patient presents with   Blurred Vision   Headache    John Barnes is a 36 y.o. male.  The history is provided by the patient and medical records. No language interpreter was used.  Headache    36 year old male presenting complaining of headache.  Around noontime today he developed acute onset of intense headache follows with blurry vision that lasted for approximately an hour.  Vision normalized but headache still persist.  Headache is improving.  Described as pressure and sensation throughout his head with some nausea and light sensitivity.  He denies any diplopia, confusion, focal numbness or focal weakness or rash or neck stiffness.  He has had 1 similar episode like this several weeks ago which resolved without treatment.  Does not endorse any other medical problems.  Home Medications Prior to Admission medications   Medication Sig Start Date End Date Taking? Authorizing Provider  ketoconazole (NIZORAL) 2 % cream Apply to affected area once daily. 05/26/21   Lorrene Reid, PA-C  terbinafine (LAMISIL) 250 MG tablet Take 1 tablet by mouth daily. Patient not taking: Reported on 05/26/2021 11/08/20   Lorrene Reid, PA-C      Allergies    Patient has no known allergies.    Review of Systems   Review of Systems  Neurological:  Positive for headaches.  All other systems reviewed and are negative.   Physical Exam Updated Vital Signs BP 136/69   Pulse 71   Temp (!) 97.4 F (36.3 C) (Oral)   Resp 18   Ht 5\' 9"  (1.753 m)   Wt 99.8 kg   SpO2 97%   BMI 32.49 kg/m  Physical Exam Vitals and nursing note reviewed.  Constitutional:      General: He is not in acute distress.    Appearance: He is well-developed.  HENT:     Head: Atraumatic.  Eyes:     Extraocular Movements: Extraocular movements intact.      Conjunctiva/sclera: Conjunctivae normal.     Pupils: Pupils are equal, round, and reactive to light.  Neck:     Meningeal: Brudzinski's sign and Kernig's sign absent.  Cardiovascular:     Rate and Rhythm: Normal rate and regular rhythm.     Heart sounds: Normal heart sounds.  Pulmonary:     Effort: Pulmonary effort is normal.     Breath sounds: Normal breath sounds.  Abdominal:     General: Bowel sounds are normal.     Palpations: Abdomen is soft.  Musculoskeletal:     Cervical back: Normal range of motion and neck supple. No rigidity.  Skin:    Findings: No rash.  Neurological:     Mental Status: He is alert and oriented to person, place, and time.     GCS: GCS eye subscore is 4. GCS verbal subscore is 5. GCS motor subscore is 6.     Cranial Nerves: No cranial nerve deficit.     Sensory: No sensory deficit.     Motor: No weakness.  Psychiatric:        Mood and Affect: Mood normal.     ED Results / Procedures / Treatments   Labs (all labs ordered are listed, but only abnormal results are displayed) Labs Reviewed  CBC - Abnormal; Notable for the following components:      Result Value   WBC 12.9 (*)  All other components within normal limits  URINALYSIS, ROUTINE W REFLEX MICROSCOPIC - Abnormal; Notable for the following components:   Ketones, ur 20 (*)    Protein, ur 30 (*)    Bacteria, UA RARE (*)    All other components within normal limits  CBG MONITORING, ED - Abnormal; Notable for the following components:   Glucose-Capillary 107 (*)    All other components within normal limits  BASIC METABOLIC PANEL    EKG None  Date: 11/20/2021  Rate: 65  Rhythm: normal sinus rhythm  QRS Axis: normal  Intervals: normal  ST/T Wave abnormalities: normal  Conduction Disutrbances: none  Narrative Interpretation: early repol  Old EKG Reviewed: No significant changes noted    Radiology CT Head Wo Contrast  Result Date: 11/20/2021 CLINICAL DATA:  Blurred vision, nausea  since yesterday, head trauma EXAM: CT HEAD WITHOUT CONTRAST TECHNIQUE: Contiguous axial images were obtained from the base of the skull through the vertex without intravenous contrast. RADIATION DOSE REDUCTION: This exam was performed according to the departmental dose-optimization program which includes automated exposure control, adjustment of the mA and/or kV according to patient size and/or use of iterative reconstruction technique. COMPARISON:  None Available. FINDINGS: Brain: No acute infarct or hemorrhage. Lateral ventricles and midline structures are unremarkable. No acute extra-axial fluid collections. No mass effect. Vascular: No hyperdense vessel or unexpected calcification. Skull: Normal. Negative for fracture or focal lesion. Sinuses/Orbits: Minimal left maxillary sinus mucosal thickening. Remaining paranasal sinuses are clear. Other: None. IMPRESSION: 1. No acute intracranial process. Electronically Signed   By: Randa Ngo M.D.   On: 11/20/2021 17:19    Procedures Procedures    Medications Ordered in ED Medications  acetaminophen (TYLENOL) tablet 1,000 mg (1,000 mg Oral Given 11/20/21 1926)    ED Course/ Medical Decision Making/ A&P                           Medical Decision Making Amount and/or Complexity of Data Reviewed Labs: ordered. Radiology: ordered.  Risk OTC drugs.   BP 136/69   Pulse 71   Temp (!) 97.4 F (36.3 C) (Oral)   Resp 18   Ht 5\' 9"  (1.753 m)   Wt 99.8 kg   SpO2 97%   BMI 32.49 kg/m   7:52 PM Patient endorsed acute onset of throbbing diffuse headache with bilateral blurry vision that started approximately an hour and a half prior to being seen in the ER.  Blurry vision resolved but headaches persist.  He also endorsed some nausea but denies having fever chills confusion neck stiffness cold symptoms focal numbness or focal weakness.  On exam this is a well-appearing male sitting in the chair appears to be in no acute discomfort.  He is in a dark  room.  He has equal pupils reactive to light and accommodation with normal extraocular movement.  He is without any focal neurodeficit on exam.  He does not have any nuchal rigidity.  He has equal strength throughout and ambulate without difficulty.  -Labs ordered, independently viewed and interpreted by me.  Labs remarkable for UA with 20 ketones and 30 protein.  WBC elevated at 12.9 -The patient was maintained on a cardiac monitor.  I personally viewed and interpreted the cardiac monitored which showed an underlying rhythm of: NSR -Imaging independently viewed and interpreted by me and I agree with radiologist's interpretation.  Result remarkable for no acute changes -This patient presents to the ED for concern  of headache, this involves an extensive number of treatment options, and is a complaint that carries with it a high risk of complications and morbidity.  The differential diagnosis includes menigitis, SAH, stroke, space occupying lesion, tension headache, migraine, occipital headache, amaurosis fugax  -Co morbidities that complicate the patient evaluation includes none -Treatment includes tylenol -Reevaluation of the patient after these medicines showed that the patient resolved -PCP office notes or outside notes reviewed -Escalation to admission/observation considered: patients feels much better, is comfortable with discharge, and will follow up with PCP -Prescription medication considered, patient comfortable with OTC tylenol -Social Determinant of Health considered   8:42 PM Work-up fairly unremarkable.  On reexam patient resting comfortably stating his headache and symptoms are completely resolved.  He denies ever have true vision loss just slight blurry vision.  Doubt amaurosis fugax.  Low suspicion for red flags headache.  Recommend outpatient follow-up, return precaution given.          Final Clinical Impression(s) / ED Diagnoses Final diagnoses:  Bad headache    Rx / DC  Orders ED Discharge Orders     None         Domenic Moras, PA-C 11/20/21 2043    Tretha Sciara, MD 11/21/21 [phone]

## 2021-11-20 NOTE — ED Provider Triage Note (Signed)
Emergency Medicine Provider Triage Evaluation Note  John Barnes , a 36 y.o. male  was evaluated in triage.  Pt complains of headache. Endorsed headache and blurry vision approximately 4 hrs ago lasting 1 hr.  Blurry vision resolved, headache still present.  Endorse nausea, no focal numbness, focal weakness, neck stiffness, rash, confusion.  Report one similar episode a week ago  Review of Systems  Positive: As above Negative: As above  Physical Exam  BP (!) 152/84 (BP Location: Left Arm)   Pulse 75   Temp (!) 97.4 F (36.3 C) (Oral)   Resp 15   SpO2 100%  Gen:   Awake, no distress   Resp:  Normal effort  MSK:   Moves extremities without difficulty  Other:    Medical Decision Making  Medically screening exam initiated at 4:56 PM.  Appropriate orders placed.  JARVIS SAWA was informed that the remainder of the evaluation will be completed by another provider, this initial triage assessment does not replace that evaluation, and the importance of remaining in the ED until their evaluation is complete.     Domenic Moras, PA-C 11/20/21 1701

## 2021-12-04 ENCOUNTER — Encounter: Payer: Self-pay | Admitting: Physician Assistant

## 2021-12-04 ENCOUNTER — Ambulatory Visit (INDEPENDENT_AMBULATORY_CARE_PROVIDER_SITE_OTHER): Payer: No Typology Code available for payment source | Admitting: Physician Assistant

## 2021-12-04 VITALS — BP 125/85 | HR 90 | Resp 18 | Ht 69.0 in | Wt 220.0 lb

## 2021-12-04 DIAGNOSIS — E785 Hyperlipidemia, unspecified: Secondary | ICD-10-CM

## 2021-12-04 DIAGNOSIS — E7800 Pure hypercholesterolemia, unspecified: Secondary | ICD-10-CM

## 2021-12-04 DIAGNOSIS — Z0000 Encounter for general adult medical examination without abnormal findings: Secondary | ICD-10-CM | POA: Diagnosis not present

## 2021-12-04 DIAGNOSIS — G43109 Migraine with aura, not intractable, without status migrainosus: Secondary | ICD-10-CM | POA: Diagnosis not present

## 2021-12-04 NOTE — Patient Instructions (Signed)
Preventive Care 21-34 Years Old, Male Preventive care refers to lifestyle choices and visits with your health care provider that can promote health and wellness. Preventive care visits are also called wellness exams. What can I expect for my preventive care visit? Counseling During your preventive care visit, your health care provider may ask about your: Medical history, including: Past medical problems. Family medical history. Current health, including: Emotional well-being. Home life and relationship well-being. Sexual activity. Lifestyle, including: Alcohol, nicotine or tobacco, and drug use. Access to firearms. Diet, exercise, and sleep habits. Safety issues such as seatbelt and bike helmet use. Sunscreen use. Work and work environment. Physical exam Your health care provider may check your: Height and weight. These may be used to calculate your BMI (body mass index). BMI is a measurement that tells if you are at a healthy weight. Waist circumference. This measures the distance around your waistline. This measurement also tells if you are at a healthy weight and may help predict your risk of certain diseases, such as type 2 diabetes and high blood pressure. Heart rate and blood pressure. Body temperature. Skin for abnormal spots. What immunizations do I need?  Vaccines are usually given at various ages, according to a schedule. Your health care provider will recommend vaccines for you based on your age, medical history, and lifestyle or other factors, such as travel or where you work. What tests do I need? Screening Your health care provider may recommend screening tests for certain conditions. This may include: Lipid and cholesterol levels. Diabetes screening. This is done by checking your blood sugar (glucose) after you have not eaten for a while (fasting). Hepatitis B test. Hepatitis C test. HIV (human immunodeficiency virus) test. STI (sexually transmitted infection)  testing, if you are at risk. Talk with your health care provider about your test results, treatment options, and if necessary, the need for more tests. Follow these instructions at home: Eating and drinking  Eat a healthy diet that includes fresh fruits and vegetables, whole grains, lean protein, and low-fat dairy products. Drink enough fluid to keep your urine pale yellow. Take vitamin and mineral supplements as recommended by your health care provider. Do not drink alcohol if your health care provider tells you not to drink. If you drink alcohol: Limit how much you have to 0-2 drinks a day. Know how much alcohol is in your drink. In the [place] one drink equals one 12 oz bottle of beer (355 mL), one 5 oz glass of wine (148 mL), or one 1 oz glass of hard liquor (44 mL). Lifestyle Brush your teeth every morning and night with fluoride toothpaste. Floss one time each day. Exercise for at least 30 minutes 5 or more days each week. Do not use any products that contain nicotine or tobacco. These products include cigarettes, chewing tobacco, and vaping devices, such as e-cigarettes. If you need help quitting, ask your health care provider. Do not use drugs. If you are sexually active, practice safe sex. Use a condom or other form of protection to prevent STIs. Find healthy ways to manage stress, such as: Meditation, yoga, or listening to music. Journaling. Talking to a trusted person. Spending time with friends and family. Minimize exposure to UV radiation to reduce your risk of skin cancer. Safety Always wear your seat belt while driving or riding in a vehicle. Do not drive: If you have been drinking alcohol. Do not ride with someone who has been drinking. If you have been using any mind-altering substances   or drugs. While texting. When you are tired or distracted. Wear a helmet and other protective equipment during sports activities. If you have firearms in your house, make sure you  follow all gun safety procedures. Seek help if you have been physically or sexually abused. What's next? Go to your health care provider once a year for an annual wellness visit. Ask your health care provider how often you should have your eyes and teeth checked. Stay up to date on all vaccines. This information is not intended to replace advice given to you by your health care provider. Make sure you discuss any questions you have with your health care provider. Document Revised: 07/03/2020 Document Reviewed: 07/03/2020 Elsevier Patient Education  2023 Elsevier Inc.  

## 2021-12-04 NOTE — Progress Notes (Signed)
Complete physical exam   Patient: John Barnes   DOB: Mar 15, 1985   36 y.o. Male  MRN: [id number] Visit Date: 12/04/2021   Chief Complaint  Patient presents with   Annual Exam    Non fasting    Subjective    John Barnes is a 36 y.o. male who presents today for a complete physical exam.  He reports consuming a general diet. The patient does not participate in regular exercise at present. He generally feels fairly well. He does not have additional problems to discuss today.     History reviewed. No pertinent past medical history. History reviewed. No pertinent surgical history. Social History   Socioeconomic History   Marital status: Married    Spouse name: Not on file   Number of children: Not on file   Years of education: Not on file   Highest education level: Not on file  Occupational History   Not on file  Tobacco Use   Smoking status: Never   Smokeless tobacco: Never  Vaping Use   Vaping Use: Never used  Substance and Sexual Activity   Alcohol use: Yes    Comment: occ   Drug use: Never   Sexual activity: Yes  Other Topics Concern   Not on file  Social History Narrative   Not on file   Social Determinants of Health   Financial Resource Strain: Not on file  Food Insecurity: Not on file  Transportation Needs: Not on file  Physical Activity: Not on file  Stress: Not on file  Social Connections: Not on file  Intimate Partner Violence: Not on file     Medications: Outpatient Medications Prior to Visit  Medication Sig   [DISCONTINUED] ketoconazole (NIZORAL) 2 % cream Apply to affected area once daily. (Patient not taking: Reported on 12/04/2021)   [DISCONTINUED] terbinafine (LAMISIL) 250 MG tablet Take 1 tablet by mouth daily. (Patient not taking: Reported on 12/04/2021)   No facility-administered medications prior to visit.    Review of Systems Review of Systems:  A fourteen system review of systems was performed and found to be positive as  per HPI.  Last CBC Lab Results  Component Value Date   WBC 12.9 (H) 11/20/2021   HGB 16.1 11/20/2021   HCT 48.3 11/20/2021   MCV 85.8 11/20/2021   MCH 28.6 11/20/2021   RDW 13.1 11/20/2021   PLT 329 11/20/2021   Last metabolic panel Lab Results  Component Value Date   GLUCOSE 97 11/20/2021   NA 137 11/20/2021   K 4.3 11/20/2021   CL 104 11/20/2021   CO2 24 11/20/2021   BUN 17 11/20/2021   CREATININE 0.91 11/20/2021   GFRNONAA >60 11/20/2021   CALCIUM 9.9 11/20/2021   PROT 7.2 10/24/2020   ALBUMIN 4.8 10/24/2020   LABGLOB 2.4 10/24/2020   AGRATIO 2.0 10/24/2020   BILITOT 0.3 10/24/2020   ALKPHOS 67 10/24/2020   AST 23 10/24/2020   ALT 27 10/24/2020   ANIONGAP 9 11/20/2021   Last lipids Lab Results  Component Value Date   CHOL 202 (H) 05/26/2021   HDL 23 (L) 05/26/2021   LDLCALC 127 (H) 05/26/2021   TRIG 288 (H) 05/26/2021   CHOLHDL 8.8 (H) 05/26/2021   Last hemoglobin A1c Lab Results  Component Value Date   HGBA1C 5.2 10/24/2020   Last thyroid functions Lab Results  Component Value Date   TSH 2.300 10/24/2020   Last vitamin D No results found for: "25OHVITD2", "25OHVITD3", "VD25OH"  Objective    BP 125/85 (BP Location: Left Arm, Patient Position: Sitting, Cuff Size: Large)   Pulse 90   Resp 18   Ht 5\' 9"  (1.753 m)   Wt 220 lb (99.8 kg)   SpO2 96%   BMI 32.49 kg/m  BP Readings from Last 3 Encounters:  12/04/21 125/85  11/20/21 135/84  05/26/21 112/73   Wt Readings from Last 3 Encounters:  12/04/21 220 lb (99.8 kg)  11/20/21 220 lb (99.8 kg)  05/26/21 212 lb (96.2 kg)      Physical Exam   General Appearance:    Alert, cooperative, in no acute distress, appears stated age  Head:    Normocephalic, without obvious abnormality, atraumatic  Eyes:    PERRL, conjunctiva/corneas clear, EOM's intact, fundi    benign, both eyes       Ears:    Normal TM's and external ear canals, both ears  Nose:   Nares normal, septum midline, mucosa normal,  no drainage   or sinus tenderness  Throat:   Lips, mucosa, and tongue normal; teeth and gums normal  Neck:   Supple, symmetrical, trachea midline, no adenopathy;       thyroid:  No enlargement/tenderness/nodules; no JVD  Back:     Symmetric, no curvature, ROM normal, no CVA tenderness  Lungs:     Clear to auscultation bilaterally, respirations unlabored  Chest wall:    No tenderness or deformity  Heart:    Normal heart rate. Normal rhythm. No murmurs, rubs, or gallops.  S1 and S2 normal  Abdomen:     Soft, non-tender, bowel sounds active all four quadrants,    no masses, no organomegaly  Genitalia:    deferred  Rectal:    deferred  Extremities:   All extremities are intact. No cyanosis or edema  Pulses:   2+ and symmetric all extremities  Skin:   Skin color, texture, turgor normal, no rashes or lesions  Lymph nodes:   Cervical, supraclavicular nodes normal  Neurologic:   CNII-XII grossly intact.      Last depression screening scores    12/04/2021    2:33 PM 05/26/2021    8:11 AM 11/26/2020    9:16 AM  PHQ 2/9 Scores  PHQ - 2 Score 0 0 0  PHQ- 9 Score 0 0 0   Last fall risk screening    12/04/2021    2:33 PM  Fall Risk   Falls in the past year? 0  Number falls in past yr: 0  Injury with Fall? 0     No results found for any visits on 12/04/21.  Assessment & Plan    Routine Health Maintenance and Physical Exam  Exercise Activities and Dietary recommendations -Discussed heart healthy diet low in fat and carbohydrates. Recommend moderate exercise 150 mins/wk.  Immunization History  Administered Date(s) Administered   Tdap 08/09/2017    Health Maintenance  Topic Date Due   COVID-19 Vaccine (1) Never done   Hepatitis C Screening  Never done   INFLUENZA VACCINE  04/19/2022 (Originally 08/19/2021)   TETANUS/TDAP  08/10/2027   HIV Screening  Completed   HPV VACCINES  Aged Out    Discussed health benefits of physical activity, and encouraged him to engage in regular  exercise appropriate for his age and condition.  Problem List Items Addressed This Visit       Other   Healthcare maintenance - Primary   Elevated LDL cholesterol level   Other Visit Diagnoses  Hyperlipidemia, unspecified hyperlipidemia type       Migraine with aura and without status migrainosus, not intractable          Advised to schedule lab visit for routine fasting blood-work. Pt deferred flu vaccine. Discussed good hydration. Discussed migraine triggers including caffeine intake. Discussed if has frequent headaches or they become severe then recommend to follow-up for further evaluation/management. Follow-up in 1 yr for CPE and FBW or sooner if needed.  Return in about 1 year (around 12/05/2022) for CPE and FBW; lab visit in 1-3 weeks for FBW.       Mayer Masker, PA-C  [facility] [phone] (phone) [phone] (fax)  [facility]

## 2021-12-09 ENCOUNTER — Other Ambulatory Visit: Payer: Self-pay

## 2021-12-09 DIAGNOSIS — E785 Hyperlipidemia, unspecified: Secondary | ICD-10-CM

## 2021-12-09 DIAGNOSIS — Z0000 Encounter for general adult medical examination without abnormal findings: Secondary | ICD-10-CM

## 2021-12-16 ENCOUNTER — Other Ambulatory Visit: Payer: No Typology Code available for payment source

## 2021-12-16 DIAGNOSIS — E785 Hyperlipidemia, unspecified: Secondary | ICD-10-CM

## 2021-12-16 DIAGNOSIS — Z0000 Encounter for general adult medical examination without abnormal findings: Secondary | ICD-10-CM

## 2021-12-17 LAB — COMPREHENSIVE METABOLIC PANEL
ALT: 35 IU/L (ref 0–44)
AST: 22 IU/L (ref 0–40)
Albumin/Globulin Ratio: 2 (ref 1.2–2.2)
Albumin: 4.8 g/dL (ref 4.1–5.1)
Alkaline Phosphatase: 74 IU/L (ref 44–121)
BUN/Creatinine Ratio: 16 (ref 9–20)
BUN: 16 mg/dL (ref 6–20)
Bilirubin Total: 0.3 mg/dL (ref 0.0–1.2)
CO2: 21 mmol/L (ref 20–29)
Calcium: 9.5 mg/dL (ref 8.7–10.2)
Chloride: 103 mmol/L (ref 96–106)
Creatinine, Ser: 0.99 mg/dL (ref 0.76–1.27)
Globulin, Total: 2.4 g/dL (ref 1.5–4.5)
Glucose: 92 mg/dL (ref 70–99)
Potassium: 4.3 mmol/L (ref 3.5–5.2)
Sodium: 140 mmol/L (ref 134–144)
Total Protein: 7.2 g/dL (ref 6.0–8.5)
eGFR: 103 mL/min/{1.73_m2} (ref >59)

## 2021-12-17 LAB — CBC WITH DIFFERENTIAL/PLATELET
Basophils Absolute: 0 10*3/uL (ref 0.0–0.2)
Basos: 1 %
EOS (ABSOLUTE): 0.2 10*3/uL (ref 0.0–0.4)
Eos: 2 %
Hematocrit: 48.9 % (ref 37.5–51.0)
Hemoglobin: 16.2 g/dL (ref 13.0–17.7)
Immature Grans (Abs): 0 10*3/uL (ref 0.0–0.1)
Immature Granulocytes: 0 %
Lymphocytes Absolute: 2.4 10*3/uL (ref 0.7–3.1)
Lymphs: 35 %
MCH: 28.1 pg (ref 26.6–33.0)
MCHC: 33.1 g/dL (ref 31.5–35.7)
MCV: 85 fL (ref 79–97)
Monocytes Absolute: 0.5 10*3/uL (ref 0.1–0.9)
Monocytes: 7 %
Neutrophils Absolute: 3.7 10*3/uL (ref 1.4–7.0)
Neutrophils: 55 %
Platelets: 322 10*3/uL (ref 150–450)
RBC: 5.77 x10E6/uL (ref 4.14–5.80)
RDW: 13.1 % (ref 11.6–15.4)
WBC: 6.8 10*3/uL (ref 3.4–10.8)

## 2021-12-17 LAB — HEMOGLOBIN A1C
Est. average glucose Bld gHb Est-mCnc: 105 mg/dL
Hgb A1c MFr Bld: 5.3 % (ref 4.8–5.6)

## 2021-12-17 LAB — TSH: TSH: 4.74 u[IU]/mL — ABNORMAL HIGH (ref 0.450–4.500)

## 2021-12-17 LAB — LIPID PANEL
Chol/HDL Ratio: 9 ratio — ABNORMAL HIGH (ref 0.0–5.0)
Cholesterol, Total: 199 mg/dL (ref 100–199)
HDL: 22 mg/dL — ABNORMAL LOW (ref >39)
LDL Chol Calc (NIH): 96 mg/dL (ref 0–99)
Triglycerides: 482 mg/dL — ABNORMAL HIGH (ref 0–149)
VLDL Cholesterol Cal: 81 mg/dL — ABNORMAL HIGH (ref 5–40)

## 2022-01-15 ENCOUNTER — Telehealth: Payer: Self-pay | Admitting: *Deleted

## 2022-01-15 NOTE — Telephone Encounter (Signed)
Called patient at contact number provided and was informed by patient's spouse to contact him at [phone] (cell). That number appears to be out of service. Mychart message was sent with provider's treatment recommendations.

## 2022-01-15 NOTE — Progress Notes (Signed)
Intend to start cholesterol medication if patient agreeable. Recheck labs in 3 months

## 2022-01-15 NOTE — Telephone Encounter (Signed)
Please let the patient know that cholesterol was very elevated, even above the levels from last year. Please ask if he was fasting. The level of increased cholesterol does show that he is at increase risk of cardiovascular problems in the future. It is my recommendation that I start him on cholesterol lowering medication. His other labs were essentially normal.  Thanks so much.   -HB

## 2022-01-15 NOTE — Telephone Encounter (Signed)
Pt wife calling to see if provider could review patients labs and give him a call with the results.  She said that no one had reached out to him and that there were some abnormal results.  John Barnes, CMA

## 2022-01-15 NOTE — Telephone Encounter (Signed)
Pt called back and was given the result note per Heather.   Pt is willing to start the recommended cholesterol lowering medication.   Pharmacy: [facility] - [place], [place] - [address]

## 2022-01-20 ENCOUNTER — Other Ambulatory Visit: Payer: Self-pay | Admitting: Nurse Practitioner

## 2022-01-20 DIAGNOSIS — E785 Hyperlipidemia, unspecified: Secondary | ICD-10-CM

## 2022-01-20 MED ORDER — ROSUVASTATIN CALCIUM 10 MG PO TABS
5.0000 mg | ORAL_TABLET | Freq: Every day | ORAL | 1 refills | Status: DC
  Filled 2022-02-27 (×2): qty 45, 90d supply, fill #0
  Filled 2022-09-16: qty 15, 30d supply, fill #1

## 2022-01-20 NOTE — Telephone Encounter (Signed)
Please let the patient know that I sent in prescription for crestor 10 mg daily to [facility]. I recommend he take this at dinner time. He should schedule a follow up in 3 months and fasting lipid panel and CMP a week prior to the visit if possible.  Thanks  -HB

## 2022-01-27 ENCOUNTER — Ambulatory Visit: Payer: No Typology Code available for payment source | Admitting: Dermatology

## 2022-02-05 DIAGNOSIS — G4737 Central sleep apnea in conditions classified elsewhere: Secondary | ICD-10-CM | POA: Diagnosis not present

## 2022-02-05 DIAGNOSIS — G4733 Obstructive sleep apnea (adult) (pediatric): Secondary | ICD-10-CM | POA: Diagnosis not present

## 2022-02-14 ENCOUNTER — Encounter (HOSPITAL_COMMUNITY): Payer: Self-pay

## 2022-02-14 ENCOUNTER — Ambulatory Visit (HOSPITAL_COMMUNITY)
Admission: EM | Admit: 2022-02-14 | Discharge: 2022-02-14 | Disposition: A | Discharge status: Home / Self Care | Payer: 59 | Attending: Physician Assistant | Admitting: Physician Assistant

## 2022-02-14 DIAGNOSIS — K0889 Other specified disorders of teeth and supporting structures: Secondary | ICD-10-CM

## 2022-02-14 MED ORDER — AMOXICILLIN-POT CLAVULANATE 875-125 MG PO TABS: 1.0000 | ORAL_TABLET | Freq: Two times a day (BID) | ORAL | 0 refills | Status: DC

## 2022-02-14 NOTE — ED Provider Notes (Signed)
[facility]    CSN: [id number] Arrival date & time: 02/14/22  1542      History   Chief Complaint Chief Complaint  Patient presents with  . Facial Pain    Pain on left side of face from ear down to neck - Entered by patient    HPI John Barnes is a 37 y.o. male.   Patient complains of intermittent left-sided facial pain around his jaw that started few weeks ago.  He reports pain comes and goes, nothing seems to make it better or worse.  He has a crown to the left lower molar that he thinks might have a problem.  He has an appointment with his dentist in about a week.  Denies fever chills nausea vomiting.  He reports normal pain at this time.   History reviewed. No pertinent past medical history.  Patient Active Problem List   Diagnosis Date Noted  . Left ankle sprain 02/20/2019  . Elevated LDL cholesterol level 08/09/2017  . Smokeless tobacco use 08/09/2017  . Healthcare maintenance 05/13/2017  . Sperm absent 03/19/2017    History reviewed. No pertinent surgical history.     Home Medications    Prior to Admission medications   Medication Sig Start Date End Date Taking? Authorizing Provider  amoxicillin-clavulanate (AUGMENTIN) 875-125 MG tablet Take 1 tablet by mouth every 12 (twelve) hours. 02/14/22  Yes Ward, Lenise Arena, PA-C  rosuvastatin (CRESTOR) 10 MG tablet Take 0.5 tablets (5 mg total) by mouth daily. 01/20/22  Yes Ronnell Freshwater, NP    Family History Family History  Problem Relation Age of Onset  . Diabetes Maternal Grandfather     Social History Social History   Tobacco Use  . Smoking status: Never  . Smokeless tobacco: Never  Vaping Use  . Vaping Use: Never used  Substance Use Topics  . Alcohol use: Yes    Comment: occ  . Drug use: Never     Allergies   Patient has no known allergies.   Review of Systems Review of Systems  Constitutional:  Negative for chills and fever.  HENT:  Positive for dental problem. Negative for  ear pain and sore throat.   Eyes:  Negative for pain and visual disturbance.  Respiratory:  Negative for cough and shortness of breath.   Cardiovascular:  Negative for chest pain and palpitations.  Gastrointestinal:  Negative for abdominal pain and vomiting.  Genitourinary:  Negative for dysuria and hematuria.  Musculoskeletal:  Negative for arthralgias and back pain.  Skin:  Negative for color change and rash.  Neurological:  Negative for seizures and syncope.  All other systems reviewed and are negative.    Physical Exam Triage Vital Signs ED Triage Vitals  Enc Vitals Group     BP 02/14/22 1702 132/84     Pulse Rate 02/14/22 1702 75     Resp 02/14/22 1702 16     Temp 02/14/22 1702 98.4 F (36.9 C)     Temp Source 02/14/22 1702 Oral     SpO2 02/14/22 1702 95 %     Weight --      Height --      Head Circumference --      Peak Flow --      Pain Score 02/14/22 1659 10     Pain Loc --      Pain Edu? --      Excl. in [place]? --    No data found.  Updated Vital Signs BP  132/84 (BP Location: Left Arm)   Pulse 75   Temp 98.4 F (36.9 C) (Oral)   Resp 16   SpO2 95%   Visual Acuity Right Eye Distance:   Left Eye Distance:   Bilateral Distance:    Right Eye Near:   Left Eye Near:    Bilateral Near:     Physical Exam Vitals and nursing note reviewed.  Constitutional:      General: He is not in acute distress.    Appearance: He is well-developed.  HENT:     Head: Normocephalic and atraumatic.  Eyes:     Conjunctiva/sclera: Conjunctivae normal.  Cardiovascular:     Rate and Rhythm: Normal rate and regular rhythm.     Heart sounds: No murmur heard. Pulmonary:     Effort: Pulmonary effort is normal. No respiratory distress.     Breath sounds: Normal breath sounds.  Abdominal:     Palpations: Abdomen is soft.     Tenderness: There is no abdominal tenderness.  Musculoskeletal:        General: No swelling.     Cervical back: Neck supple.  Skin:    General: Skin is  warm and dry.     Capillary Refill: Capillary refill takes less than 2 seconds.  Neurological:     Mental Status: He is alert.  Psychiatric:        Mood and Affect: Mood normal.     UC Treatments / Results  Labs (all labs ordered are listed, but only abnormal results are displayed) Labs Reviewed - No data to display  EKG   Radiology No results found.  Procedures Procedures (including critical care time)  Medications Ordered in UC Medications - No data to display  Initial Impression / Assessment and Plan / UC Course  I have reviewed the triage vital signs and the nursing notes.  Pertinent labs & imaging results that were available during my care of the patient were reviewed by me and considered in my medical decision making (see chart for details).     Dental pain.  Will start antibiotic to cover for infection.  Advise continue Tylenol or ibuprofen as needed.  Advised to keep upcoming dental appointment. Final Clinical Impressions(s) / UC Diagnoses   Final diagnoses:  Pain, dental     Discharge Instructions      Take antibiotic as prescribed Continue with Tylenol and/or Ibuprofen as needed Keep upcoming visit with dentist.    ED Prescriptions     Medication Sig Dispense Auth. Provider   amoxicillin-clavulanate (AUGMENTIN) 875-125 MG tablet Take 1 tablet by mouth every 12 (twelve) hours. 14 tablet Ward, Lenise Arena, PA-C      PDMP not reviewed this encounter.   Ward, Lenise Arena, PA-C 02/14/22 1715

## 2022-02-14 NOTE — ED Triage Notes (Signed)
Pt is here for facial pain shoots down to ear and jaw on left side xfew days

## 2022-02-14 NOTE — Discharge Instructions (Signed)
Take antibiotic as prescribed Continue with Tylenol and/or Ibuprofen as needed Keep upcoming visit with dentist.

## 2022-02-16 DIAGNOSIS — R0681 Apnea, not elsewhere classified: Secondary | ICD-10-CM | POA: Diagnosis not present

## 2022-02-16 DIAGNOSIS — G4733 Obstructive sleep apnea (adult) (pediatric): Secondary | ICD-10-CM | POA: Diagnosis not present

## 2022-02-16 DIAGNOSIS — R0683 Snoring: Secondary | ICD-10-CM | POA: Diagnosis not present

## 2022-02-16 DIAGNOSIS — G4737 Central sleep apnea in conditions classified elsewhere: Secondary | ICD-10-CM | POA: Diagnosis not present

## 2022-02-27 ENCOUNTER — Other Ambulatory Visit (HOSPITAL_COMMUNITY): Payer: Self-pay

## 2022-03-19 DIAGNOSIS — G4737 Central sleep apnea in conditions classified elsewhere: Secondary | ICD-10-CM | POA: Diagnosis not present

## 2022-03-19 DIAGNOSIS — R0683 Snoring: Secondary | ICD-10-CM | POA: Diagnosis not present

## 2022-03-19 DIAGNOSIS — R0681 Apnea, not elsewhere classified: Secondary | ICD-10-CM | POA: Diagnosis not present

## 2022-03-19 DIAGNOSIS — G4733 Obstructive sleep apnea (adult) (pediatric): Secondary | ICD-10-CM | POA: Diagnosis not present

## 2022-04-09 ENCOUNTER — Other Ambulatory Visit: Payer: Self-pay

## 2022-04-09 DIAGNOSIS — E785 Hyperlipidemia, unspecified: Secondary | ICD-10-CM

## 2022-04-09 DIAGNOSIS — Z0000 Encounter for general adult medical examination without abnormal findings: Secondary | ICD-10-CM

## 2022-04-14 ENCOUNTER — Other Ambulatory Visit: Payer: 59

## 2022-04-14 DIAGNOSIS — Z0000 Encounter for general adult medical examination without abnormal findings: Secondary | ICD-10-CM

## 2022-04-14 DIAGNOSIS — E785 Hyperlipidemia, unspecified: Secondary | ICD-10-CM

## 2022-04-15 LAB — COMPREHENSIVE METABOLIC PANEL
ALT: 28 IU/L (ref 0–44)
AST: 20 IU/L (ref 0–40)
Albumin/Globulin Ratio: 1.9 (ref 1.2–2.2)
Albumin: 5.1 g/dL (ref 4.1–5.1)
Alkaline Phosphatase: 70 IU/L (ref 44–121)
BUN/Creatinine Ratio: 15 (ref 9–20)
BUN: 15 mg/dL (ref 6–20)
Bilirubin Total: 0.5 mg/dL (ref 0.0–1.2)
CO2: 20 mmol/L (ref 20–29)
Calcium: 10.3 mg/dL — ABNORMAL HIGH (ref 8.7–10.2)
Chloride: 101 mmol/L (ref 96–106)
Creatinine, Ser: 1.02 mg/dL (ref 0.76–1.27)
Globulin, Total: 2.7 g/dL (ref 1.5–4.5)
Glucose: 82 mg/dL (ref 70–99)
Potassium: 4.5 mmol/L (ref 3.5–5.2)
Sodium: 142 mmol/L (ref 134–144)
Total Protein: 7.8 g/dL (ref 6.0–8.5)
eGFR: 99 mL/min/{1.73_m2} (ref >59)

## 2022-04-15 LAB — LIPID PANEL
Chol/HDL Ratio: 5.1 ratio — ABNORMAL HIGH (ref 0.0–5.0)
Cholesterol, Total: 128 mg/dL (ref 100–199)
HDL: 25 mg/dL — ABNORMAL LOW (ref >39)
LDL Chol Calc (NIH): 78 mg/dL (ref 0–99)
Triglycerides: 141 mg/dL (ref 0–149)
VLDL Cholesterol Cal: 25 mg/dL (ref 5–40)

## 2022-04-21 ENCOUNTER — Encounter: Payer: Self-pay | Admitting: Family Medicine

## 2022-04-21 ENCOUNTER — Ambulatory Visit (INDEPENDENT_AMBULATORY_CARE_PROVIDER_SITE_OTHER): Payer: 59 | Admitting: Family Medicine

## 2022-04-21 VITALS — BP 125/84 | HR 67 | Resp 18 | Ht 69.0 in | Wt 201.0 lb

## 2022-04-21 DIAGNOSIS — E7800 Pure hypercholesterolemia, unspecified: Secondary | ICD-10-CM | POA: Diagnosis not present

## 2022-04-21 NOTE — Assessment & Plan Note (Addendum)
Lipid panel: LDL 78, HDL 25, triglycerides 141.  CMP within normal limits.  Continue rosuvastatin 5 mg daily.  Will continue to monitor.

## 2022-04-21 NOTE — Patient Instructions (Signed)
Keep up the good work, it was nice to meet you today!

## 2022-04-21 NOTE — Progress Notes (Signed)
   Established Patient Office Visit  Subjective   Patient ID: John Barnes, male    DOB: [birth year]  Age: 37 y.o. MRN: [id number]  Chief Complaint  Patient presents with   Hyperlipidemia    HPI John Barnes is a 37 y.o. male presenting today for follow up of hyperlipidemia. Hyperlipidemia: tolerating Crestor well with no myalgias or significant side effects. Currently consuming a  low-carb, keto  diet. The patient does not participate in regular exercise at present. The ASCVD Risk score (Arnett DK, et al., 2019) failed to calculate for the following reasons:   The 2019 ASCVD risk score is only valid for ages 57 to 55 Migraines have resolved, determined to be due to caffeine withdrawal.  ROS Negative unless otherwise noted in HPI   Objective:     BP 125/84 (BP Location: Left Arm, Patient Position: Sitting, Cuff Size: Normal)   Pulse 67   Resp 18   Ht 5\' 9"  (1.753 m)   Wt 201 lb (91.2 kg)   SpO2 97%   BMI 29.68 kg/m   Physical Exam Vitals reviewed.  Constitutional:      General: He is not in acute distress.    Appearance: Normal appearance. He is not ill-appearing.  HENT:     Head: Normocephalic and atraumatic.     Nose: Nose normal.  Eyes:     Conjunctiva/sclera: Conjunctivae normal.  Cardiovascular:     Rate and Rhythm: Normal rate and regular rhythm.     Heart sounds: No murmur heard.    No friction rub. No gallop.  Pulmonary:     Effort: Pulmonary effort is normal.     Breath sounds: No wheezing, rhonchi or rales.  Musculoskeletal:     Cervical back: Normal range of motion.  Skin:    General: Skin is warm and dry.     Coloration: Skin is not jaundiced or pale.     Findings: No bruising.  Neurological:     Mental Status: He is alert and oriented to person, place, and time.  Psychiatric:        Mood and Affect: Mood normal.     Assessment & Plan:  Elevated LDL cholesterol level Assessment & Plan: Lipid panel: LDL 78, HDL 25, triglycerides 141.  CMP  within normal limits.  Continue rosuvastatin 5 mg daily.  Will continue to monitor.   Patient does not need any refills at this time.  Return in about 8 months (around 12/07/2022) for annual physical, fasting blood work 1 week before (last CPE 12/04/21).    Velva Harman, PA

## 2022-06-17 DIAGNOSIS — G4737 Central sleep apnea in conditions classified elsewhere: Secondary | ICD-10-CM | POA: Diagnosis not present

## 2022-06-17 DIAGNOSIS — G4733 Obstructive sleep apnea (adult) (pediatric): Secondary | ICD-10-CM | POA: Diagnosis not present

## 2022-06-17 DIAGNOSIS — R0681 Apnea, not elsewhere classified: Secondary | ICD-10-CM | POA: Diagnosis not present

## 2022-06-17 DIAGNOSIS — R0683 Snoring: Secondary | ICD-10-CM | POA: Diagnosis not present

## 2022-06-28 DIAGNOSIS — G4737 Central sleep apnea in conditions classified elsewhere: Secondary | ICD-10-CM | POA: Diagnosis not present

## 2022-06-28 DIAGNOSIS — G4733 Obstructive sleep apnea (adult) (pediatric): Secondary | ICD-10-CM | POA: Diagnosis not present

## 2022-07-15 DIAGNOSIS — G4733 Obstructive sleep apnea (adult) (pediatric): Secondary | ICD-10-CM | POA: Diagnosis not present

## 2022-07-15 DIAGNOSIS — G4737 Central sleep apnea in conditions classified elsewhere: Secondary | ICD-10-CM | POA: Diagnosis not present

## 2022-07-18 DIAGNOSIS — G4737 Central sleep apnea in conditions classified elsewhere: Secondary | ICD-10-CM | POA: Diagnosis not present

## 2022-07-18 DIAGNOSIS — R0683 Snoring: Secondary | ICD-10-CM | POA: Diagnosis not present

## 2022-07-18 DIAGNOSIS — R0681 Apnea, not elsewhere classified: Secondary | ICD-10-CM | POA: Diagnosis not present

## 2022-07-18 DIAGNOSIS — G4733 Obstructive sleep apnea (adult) (pediatric): Secondary | ICD-10-CM | POA: Diagnosis not present

## 2022-07-28 DIAGNOSIS — G4733 Obstructive sleep apnea (adult) (pediatric): Secondary | ICD-10-CM | POA: Diagnosis not present

## 2022-07-28 DIAGNOSIS — G4737 Central sleep apnea in conditions classified elsewhere: Secondary | ICD-10-CM | POA: Diagnosis not present

## 2022-07-30 DIAGNOSIS — G4733 Obstructive sleep apnea (adult) (pediatric): Secondary | ICD-10-CM | POA: Diagnosis not present

## 2022-07-30 DIAGNOSIS — G4737 Central sleep apnea in conditions classified elsewhere: Secondary | ICD-10-CM | POA: Diagnosis not present

## 2022-08-28 DIAGNOSIS — G4733 Obstructive sleep apnea (adult) (pediatric): Secondary | ICD-10-CM | POA: Diagnosis not present

## 2022-08-28 DIAGNOSIS — G4737 Central sleep apnea in conditions classified elsewhere: Secondary | ICD-10-CM | POA: Diagnosis not present

## 2022-08-30 DIAGNOSIS — G4733 Obstructive sleep apnea (adult) (pediatric): Secondary | ICD-10-CM | POA: Diagnosis not present

## 2022-08-30 DIAGNOSIS — G4737 Central sleep apnea in conditions classified elsewhere: Secondary | ICD-10-CM | POA: Diagnosis not present

## 2022-09-18 ENCOUNTER — Other Ambulatory Visit (HOSPITAL_COMMUNITY): Payer: Self-pay

## 2022-09-30 DIAGNOSIS — G4733 Obstructive sleep apnea (adult) (pediatric): Secondary | ICD-10-CM | POA: Diagnosis not present

## 2022-09-30 DIAGNOSIS — G4737 Central sleep apnea in conditions classified elsewhere: Secondary | ICD-10-CM | POA: Diagnosis not present

## 2022-11-23 ENCOUNTER — Other Ambulatory Visit: Payer: Self-pay

## 2022-11-23 DIAGNOSIS — Z0000 Encounter for general adult medical examination without abnormal findings: Secondary | ICD-10-CM

## 2022-11-23 DIAGNOSIS — E785 Hyperlipidemia, unspecified: Secondary | ICD-10-CM

## 2022-12-01 ENCOUNTER — Other Ambulatory Visit: Payer: 59

## 2022-12-01 DIAGNOSIS — Z0000 Encounter for general adult medical examination without abnormal findings: Secondary | ICD-10-CM

## 2022-12-01 DIAGNOSIS — E785 Hyperlipidemia, unspecified: Secondary | ICD-10-CM | POA: Diagnosis not present

## 2022-12-02 LAB — COMPREHENSIVE METABOLIC PANEL
ALT: 17 [IU]/L (ref 0–44)
AST: 16 [IU]/L (ref 0–40)
Albumin: 4.8 g/dL (ref 4.1–5.1)
Alkaline Phosphatase: 56 [IU]/L (ref 44–121)
BUN/Creatinine Ratio: 20 (ref 9–20)
BUN: 19 mg/dL (ref 6–20)
Bilirubin Total: 0.5 mg/dL (ref 0.0–1.2)
CO2: 21 mmol/L (ref 20–29)
Calcium: 9.7 mg/dL (ref 8.7–10.2)
Chloride: 100 mmol/L (ref 96–106)
Creatinine, Ser: 0.94 mg/dL (ref 0.76–1.27)
Globulin, Total: 2.4 g/dL (ref 1.5–4.5)
Glucose: 81 mg/dL (ref 70–99)
Potassium: 4.1 mmol/L (ref 3.5–5.2)
Sodium: 139 mmol/L (ref 134–144)
Total Protein: 7.2 g/dL (ref 6.0–8.5)
eGFR: 108 mL/min/{1.73_m2} (ref >59)

## 2022-12-02 LAB — CBC WITH DIFFERENTIAL/PLATELET
Basophils Absolute: 0 10*3/uL (ref 0.0–0.2)
Basos: 1 %
EOS (ABSOLUTE): 0.1 10*3/uL (ref 0.0–0.4)
Eos: 2 %
Hematocrit: 47.2 % (ref 37.5–51.0)
Hemoglobin: 15.7 g/dL (ref 13.0–17.7)
Immature Grans (Abs): 0 10*3/uL (ref 0.0–0.1)
Immature Granulocytes: 0 %
Lymphocytes Absolute: 2 10*3/uL (ref 0.7–3.1)
Lymphs: 36 %
MCH: 29.5 pg (ref 26.6–33.0)
MCHC: 33.3 g/dL (ref 31.5–35.7)
MCV: 89 fL (ref 79–97)
Monocytes Absolute: 0.5 10*3/uL (ref 0.1–0.9)
Monocytes: 9 %
Neutrophils Absolute: 2.9 10*3/uL (ref 1.4–7.0)
Neutrophils: 52 %
Platelets: 269 10*3/uL (ref 150–450)
RBC: 5.32 x10E6/uL (ref 4.14–5.80)
RDW: 13.1 % (ref 11.6–15.4)
WBC: 5.5 10*3/uL (ref 3.4–10.8)

## 2022-12-02 LAB — LIPID PANEL
Chol/HDL Ratio: 8.1 ratio — ABNORMAL HIGH (ref 0.0–5.0)
Cholesterol, Total: 226 mg/dL — ABNORMAL HIGH (ref 100–199)
HDL: 28 mg/dL — ABNORMAL LOW (ref >39)
LDL Chol Calc (NIH): 139 mg/dL — ABNORMAL HIGH (ref 0–99)
Triglycerides: 322 mg/dL — ABNORMAL HIGH (ref 0–149)
VLDL Cholesterol Cal: 59 mg/dL — ABNORMAL HIGH (ref 5–40)

## 2022-12-02 LAB — TSH: TSH: 3.02 u[IU]/mL (ref 0.450–4.500)

## 2022-12-02 LAB — HEMOGLOBIN A1C
Est. average glucose Bld gHb Est-mCnc: 103 mg/dL
Hgb A1c MFr Bld: 5.2 % (ref 4.8–5.6)

## 2022-12-07 ENCOUNTER — Encounter: Payer: Self-pay | Admitting: Family Medicine

## 2022-12-07 ENCOUNTER — Encounter: Payer: 59 | Admitting: Family Medicine

## 2022-12-07 ENCOUNTER — Other Ambulatory Visit (HOSPITAL_COMMUNITY): Payer: Self-pay

## 2022-12-07 ENCOUNTER — Ambulatory Visit (INDEPENDENT_AMBULATORY_CARE_PROVIDER_SITE_OTHER): Payer: 59 | Admitting: Family Medicine

## 2022-12-07 VITALS — BP 116/69 | HR 63 | Resp 18 | Ht 69.0 in | Wt 189.0 lb

## 2022-12-07 DIAGNOSIS — E7800 Pure hypercholesterolemia, unspecified: Secondary | ICD-10-CM

## 2022-12-07 DIAGNOSIS — Z1159 Encounter for screening for other viral diseases: Secondary | ICD-10-CM | POA: Diagnosis not present

## 2022-12-07 DIAGNOSIS — Z0000 Encounter for general adult medical examination without abnormal findings: Secondary | ICD-10-CM

## 2022-12-07 MED ORDER — ROSUVASTATIN CALCIUM 5 MG PO TABS
5.0000 mg | ORAL_TABLET | Freq: Every day | ORAL | 3 refills | Status: DC
  Filled 2022-12-07 – 2023-01-11 (×2): qty 30, 30d supply, fill #0
  Filled 2023-02-25: qty 30, 30d supply, fill #1
  Filled 2023-04-01: qty 30, 30d supply, fill #2
  Filled 2023-05-28: qty 30, 30d supply, fill #3
  Filled 2023-10-11: qty 30, 30d supply, fill #4

## 2022-12-07 NOTE — Progress Notes (Signed)
Complete physical exam  Patient: John Barnes   DOB: 01-02-1986   37 y.o. Male  MRN: [id number]  Subjective:    Chief Complaint  Patient presents with   Annual Exam    John Barnes is a 37 y.o. male who presents today for a complete physical exam. He reports consuming a low fat and high fiber/high protein  diet.  He notes that with the changes that he has made to his nutrition, he tried going off of rosuvastatin at the same time.  After seeing his lab results, he is aware that this was not effective and he will need to restart rosuvastatin.  He generally feels well. He reports sleeping well. He does not have additional problems to discuss today.    Most recent fall risk assessment:    12/04/2021    2:33 PM  Fall Risk   Falls in the past year? 0  Number falls in past yr: 0  Injury with Fall? 0     Most recent depression and anxiety screenings:    12/07/2022    8:13 AM 04/21/2022    8:15 AM  PHQ 2/9 Scores  PHQ - 2 Score 0 0  PHQ- 9 Score 0       12/07/2022    8:13 AM 12/04/2021    2:33 PM 05/26/2021    8:11 AM 11/26/2020    9:16 AM  GAD 7 : Generalized Anxiety Score  Nervous, Anxious, on Edge 0 0 0 0  Control/stop worrying 0 0 0 0  Worry too much - different things 0 0 0 0  Trouble relaxing 0 0 0 0  Restless 0 0 0 0  Easily annoyed or irritable 0 0 0 0  Afraid - awful might happen 0 0 0 0  Total GAD 7 Score 0 0 0 0  Anxiety Difficulty Not difficult at all Not difficult at all Not difficult at all Not difficult at all    Patient Active Problem List   Diagnosis Date Noted   Elevated LDL cholesterol level 08/09/2017    History reviewed. No pertinent surgical history. Social History   Tobacco Use   Smoking status: Never    Passive exposure: Never   Smokeless tobacco: Never  Vaping Use   Vaping status: Never Used  Substance Use Topics   Alcohol use: Yes    Comment: occ   Drug use: Never   Family History  Problem Relation Age of Onset   Diabetes  Maternal Grandfather    No Known Allergies   Patient Care Team: Melida Quitter, PA as PCP - General (Family Medicine)   Outpatient Medications Prior to Visit  Medication Sig   [DISCONTINUED] rosuvastatin (CRESTOR) 10 MG tablet Take 1/2 tablet (5 mg total) by mouth daily.   No facility-administered medications prior to visit.    Review of Systems  Constitutional:  Negative for chills, fever and malaise/fatigue.  HENT:  Negative for congestion and hearing loss.   Eyes:  Negative for blurred vision and double vision.  Respiratory:  Negative for cough and shortness of breath.   Cardiovascular:  Negative for chest pain, palpitations and leg swelling.  Gastrointestinal:  Negative for abdominal pain, constipation, diarrhea and heartburn.  Genitourinary:  Negative for frequency and urgency.  Musculoskeletal:  Negative for myalgias and neck pain.  Neurological:  Negative for headaches.  Endo/Heme/Allergies:  Negative for polydipsia.  Psychiatric/Behavioral:  Negative for depression. The patient is not nervous/anxious and does not have insomnia.  Objective:    BP 116/69 (BP Location: Left Arm, Patient Position: Sitting, Cuff Size: Normal)   Pulse 63   Resp 18   Ht 5\' 9"  (1.753 m)   Wt 189 lb (85.7 kg)   SpO2 98%   BMI 27.91 kg/m    Physical Exam Constitutional:      General: He is not in acute distress.    Appearance: Normal appearance.  HENT:     Head: Normocephalic and atraumatic.     Right Ear: Tympanic membrane, ear canal and external ear normal. There is no impacted cerumen.     Left Ear: Tympanic membrane, ear canal and external ear normal. There is no impacted cerumen.     Nose: Nose normal.     Mouth/Throat:     Mouth: Mucous membranes are moist.     Pharynx: Oropharynx is clear. No posterior oropharyngeal erythema.  Eyes:     Extraocular Movements: Extraocular movements intact.     Conjunctiva/sclera: Conjunctivae normal.     Pupils: Pupils are equal,  round, and reactive to light.     Comments: Wearing contacts  Neck:     Thyroid: No thyroid mass, thyromegaly or thyroid tenderness.  Cardiovascular:     Rate and Rhythm: Normal rate and regular rhythm.     Heart sounds: Normal heart sounds. No murmur heard.    No friction rub. No gallop.  Pulmonary:     Effort: Pulmonary effort is normal. No respiratory distress.     Breath sounds: Normal breath sounds. No stridor. No wheezing or rales.  Abdominal:     General: Bowel sounds are normal.     Palpations: Abdomen is soft. There is no mass.     Tenderness: There is no abdominal tenderness.  Musculoskeletal:        General: Normal range of motion.     Cervical back: Normal range of motion and neck supple.  Lymphadenopathy:     Cervical: No cervical adenopathy.  Skin:    General: Skin is warm and dry.  Neurological:     Mental Status: He is alert and oriented to person, place, and time.     Cranial Nerves: No cranial nerve deficit.     Motor: No weakness.     Deep Tendon Reflexes: Reflexes normal.  Psychiatric:        Mood and Affect: Mood normal.       Assessment & Plan:    Routine Health Maintenance and Physical Exam  Immunization History  Administered Date(s) Administered   Tdap 08/09/2017    Health Maintenance  Topic Date Due   Hepatitis C Screening  Never done   COVID-19 Vaccine (1 - 2023-24 season) 12/23/2022 (Originally 09/20/2022)   INFLUENZA VACCINE  04/19/2023 (Originally 08/20/2022)   DTaP/Tdap/Td (2 - Td or Tdap) 08/10/2027   HIV Screening  Completed   HPV VACCINES  Aged Out    Reviewed most recent labs including CBC, CMP, lipid panel, A1C, TSH, and vitamin D. All within normal limits/stable from last check other than LDL 139, VLDL 59, triglycerides 322, HDL 28, total cholesterol 161.  Discussed health benefits of physical activity, and encouraged him to engage in regular exercise appropriate for his age and condition.  Wellness examination  Elevated LDL  cholesterol level Assessment & Plan: Lipid panel: LDL 139, VLDL 59, triglycerides 322, HDL 28, total cholesterol 096.  CMP within normal limits.  All cholesterol values have increased significantly.  Restart rosuvastatin 5 mg daily.  Will continue to monitor.  Orders: -     Rosuvastatin Calcium; Take 1 tablet (5 mg total) by mouth daily.  Dispense: 90 tablet; Refill: 3 -     Comprehensive metabolic panel; Future -     Lipid panel; Future  Screening for viral disease -     Hepatitis C antibody; Future    Return in about 6 months (around 06/06/2023) for follow-up for HLD, fasting blood work 1 week before.     Melida Quitter, PA

## 2022-12-07 NOTE — Assessment & Plan Note (Signed)
Lipid panel: LDL 139, VLDL 59, triglycerides 322, HDL 28, total cholesterol 244.  CMP within normal limits.  All cholesterol values have increased significantly.  Restart rosuvastatin 5 mg daily.  Will continue to monitor.

## 2022-12-16 ENCOUNTER — Other Ambulatory Visit (HOSPITAL_COMMUNITY): Payer: Self-pay

## 2023-01-11 ENCOUNTER — Other Ambulatory Visit (HOSPITAL_COMMUNITY): Payer: Self-pay

## 2023-02-25 ENCOUNTER — Encounter: Payer: Self-pay | Admitting: Family Medicine

## 2023-02-25 ENCOUNTER — Other Ambulatory Visit (HOSPITAL_COMMUNITY): Payer: Self-pay

## 2023-02-26 DIAGNOSIS — G4737 Central sleep apnea in conditions classified elsewhere: Secondary | ICD-10-CM | POA: Diagnosis not present

## 2023-02-26 DIAGNOSIS — G4733 Obstructive sleep apnea (adult) (pediatric): Secondary | ICD-10-CM | POA: Diagnosis not present

## 2023-04-01 ENCOUNTER — Other Ambulatory Visit (HOSPITAL_COMMUNITY): Payer: Self-pay

## 2023-05-28 ENCOUNTER — Other Ambulatory Visit (HOSPITAL_COMMUNITY): Payer: Self-pay

## 2023-05-31 ENCOUNTER — Other Ambulatory Visit: Payer: 59

## 2023-06-07 ENCOUNTER — Other Ambulatory Visit

## 2023-06-07 ENCOUNTER — Ambulatory Visit: Payer: 59 | Admitting: Family Medicine

## 2023-06-07 DIAGNOSIS — E7800 Pure hypercholesterolemia, unspecified: Secondary | ICD-10-CM | POA: Diagnosis not present

## 2023-06-07 DIAGNOSIS — Z1159 Encounter for screening for other viral diseases: Secondary | ICD-10-CM | POA: Diagnosis not present

## 2023-06-08 ENCOUNTER — Ambulatory Visit: Payer: Self-pay | Admitting: Family Medicine

## 2023-06-08 LAB — HEPATITIS C ANTIBODY: Hep C Virus Ab: NONREACTIVE

## 2023-06-08 LAB — COMPREHENSIVE METABOLIC PANEL WITH GFR
ALT: 28 IU/L (ref 0–44)
AST: 22 IU/L (ref 0–40)
Albumin: 4.4 g/dL (ref 4.1–5.1)
Alkaline Phosphatase: 61 IU/L (ref 44–121)
BUN/Creatinine Ratio: 15 (ref 9–20)
BUN: 14 mg/dL (ref 6–20)
Bilirubin Total: 0.4 mg/dL (ref 0.0–1.2)
CO2: 20 mmol/L (ref 20–29)
Calcium: 9.4 mg/dL (ref 8.7–10.2)
Chloride: 104 mmol/L (ref 96–106)
Creatinine, Ser: 0.95 mg/dL (ref 0.76–1.27)
Globulin, Total: 2.6 g/dL (ref 1.5–4.5)
Glucose: 82 mg/dL (ref 70–99)
Potassium: 4.6 mmol/L (ref 3.5–5.2)
Sodium: 140 mmol/L (ref 134–144)
Total Protein: 7 g/dL (ref 6.0–8.5)
eGFR: 106 mL/min/{1.73_m2} (ref >59)

## 2023-06-08 LAB — LIPID PANEL
Chol/HDL Ratio: 6.3 ratio — ABNORMAL HIGH (ref 0.0–5.0)
Cholesterol, Total: 188 mg/dL (ref 100–199)
HDL: 30 mg/dL — ABNORMAL LOW (ref >39)
LDL Chol Calc (NIH): 116 mg/dL — ABNORMAL HIGH (ref 0–99)
Triglycerides: 239 mg/dL — ABNORMAL HIGH (ref 0–149)
VLDL Cholesterol Cal: 42 mg/dL — ABNORMAL HIGH (ref 5–40)

## 2023-07-02 ENCOUNTER — Ambulatory Visit (INDEPENDENT_AMBULATORY_CARE_PROVIDER_SITE_OTHER)

## 2023-07-02 VITALS — BP 110/73 | HR 55 | Ht 69.0 in | Wt 204.1 lb

## 2023-07-02 DIAGNOSIS — E7800 Pure hypercholesterolemia, unspecified: Secondary | ICD-10-CM | POA: Diagnosis not present

## 2023-07-02 NOTE — Progress Notes (Signed)
   Established Patient Office Visit  Subjective   Patient ID: John Barnes, male    DOB: [birth year]  Age: 38 y.o. MRN: [id number]  Chief Complaint  Patient presents with   Hyperlipidemia    HPI John Barnes is a 38 y.o. male who presents to the clinic today for 6 month follow up on hyperlipidemia. Aaron Aas He reports consuming a low fat and high fiber/high protein diet.   Currently taking 5 mg rosuvastatin  daily. Denies side effects or new myalgias. Does endorse that due to some recent vacations, he has missed several doses over the course of the last few months.   Denies CP, HA, SOB, palpitations, or edema.      ROS Per HPI.    Objective:     BP 110/73   Pulse (!) 55   Ht 5' 9 (1.753 m)   Wt 204 lb 1.9 oz (92.6 kg)   SpO2 98%   BMI 30.14 kg/m    Physical Exam Constitutional:      General: He is not in acute distress.    Appearance: Normal appearance.   Cardiovascular:     Rate and Rhythm: Normal rate and regular rhythm.     Heart sounds: Normal heart sounds. No murmur heard.    No friction rub. No gallop.  Pulmonary:     Effort: Pulmonary effort is normal. No respiratory distress.     Breath sounds: Normal breath sounds.   Musculoskeletal:        General: No swelling.   Skin:    General: Skin is warm and dry.   Neurological:     General: No focal deficit present.     Mental Status: He is alert.   Psychiatric:        Mood and Affect: Mood normal.        Behavior: Behavior normal.        Thought Content: Thought content normal.       No results found for any visits on 07/02/23.  Last metabolic panel Lab Results  Component Value Date   GLUCOSE 82 06/07/2023   NA 140 06/07/2023   K 4.6 06/07/2023   CL 104 06/07/2023   CO2 20 06/07/2023   BUN 14 06/07/2023   CREATININE 0.95 06/07/2023   EGFR 106 06/07/2023   CALCIUM  9.4 06/07/2023   PROT 7.0 06/07/2023   ALBUMIN 4.4 06/07/2023   LABGLOB 2.6 06/07/2023   AGRATIO 1.9 04/14/2022   BILITOT  0.4 06/07/2023   ALKPHOS 61 06/07/2023   AST 22 06/07/2023   ALT 28 06/07/2023   ANIONGAP 9 11/20/2021   Last lipids Lab Results  Component Value Date   CHOL 188 06/07/2023   HDL 30 (L) 06/07/2023   LDLCALC 116 (H) 06/07/2023   TRIG 239 (H) 06/07/2023   CHOLHDL 6.3 (H) 06/07/2023      The ASCVD Risk score (Arnett DK, et al., 2019) failed to calculate for the following reasons:   The 2019 ASCVD risk score is only valid for ages 25 to 13    Assessment & Plan:   Elevated LDL cholesterol level Assessment & Plan: Lipid panel: LDL 116, HDL 30, triglycerides 239.  Improved from his last lipid panel.  CMP within normal limits.  Continue rosuvastatin  5 mg daily.  Will continue to monitor and follow-up at his physical in 6 months.     Return in about 5 months (around 12/02/2023) for Physical.    Odilia Bennett, PA-C

## 2023-07-02 NOTE — Patient Instructions (Signed)
 It was nice to see you today!  As we discussed in clinic:  -Let's plan to continue your rosuvastatin  5 mg daily for your cholesterol. Your numbers are improving!   -Let's also plan for your yearly physical/follow up around November/December!  It was nice to meet you!  If you have any problems before your next visit feel free to message me via MyChart (minor issues or questions) or call the office, otherwise you may reach out to schedule an office visit.  Thank you! Meryl Acosta, PA-C

## 2023-07-02 NOTE — Assessment & Plan Note (Signed)
 Lipid panel: LDL 116, HDL 30, triglycerides 239.  Improved from his last lipid panel.  CMP within normal limits.  Continue rosuvastatin  5 mg daily.  Will continue to monitor and follow-up at his physical in 6 months.

## 2023-10-11 ENCOUNTER — Other Ambulatory Visit (HOSPITAL_COMMUNITY): Payer: Self-pay

## 2023-10-11 MED ORDER — AMOXICILLIN 500 MG PO CAPS
ORAL_CAPSULE | ORAL | 1 refills | Status: DC
  Filled 2023-10-11: qty 21, 7d supply, fill #0

## 2023-10-18 ENCOUNTER — Other Ambulatory Visit (HOSPITAL_COMMUNITY): Payer: Self-pay

## 2023-10-18 MED ORDER — AMOXICILLIN-POT CLAVULANATE 875-125 MG PO TABS
1.0000 | ORAL_TABLET | Freq: Two times a day (BID) | ORAL | 1 refills | Status: DC
  Filled 2023-10-18: qty 20, 10d supply, fill #0

## 2023-11-22 ENCOUNTER — Other Ambulatory Visit: Payer: Self-pay

## 2023-11-22 DIAGNOSIS — Z130 Encounter for screening for diseases of the blood and blood-forming organs and certain disorders involving the immune mechanism: Secondary | ICD-10-CM

## 2023-11-22 DIAGNOSIS — E785 Hyperlipidemia, unspecified: Secondary | ICD-10-CM

## 2023-11-25 ENCOUNTER — Other Ambulatory Visit

## 2023-11-25 DIAGNOSIS — E785 Hyperlipidemia, unspecified: Secondary | ICD-10-CM

## 2023-11-25 DIAGNOSIS — Z130 Encounter for screening for diseases of the blood and blood-forming organs and certain disorders involving the immune mechanism: Secondary | ICD-10-CM

## 2023-11-25 DIAGNOSIS — Z13228 Encounter for screening for other metabolic disorders: Secondary | ICD-10-CM | POA: Diagnosis not present

## 2023-11-25 DIAGNOSIS — Z1321 Encounter for screening for nutritional disorder: Secondary | ICD-10-CM | POA: Diagnosis not present

## 2023-11-25 DIAGNOSIS — Z1329 Encounter for screening for other suspected endocrine disorder: Secondary | ICD-10-CM | POA: Diagnosis not present

## 2023-11-26 ENCOUNTER — Other Ambulatory Visit (HOSPITAL_COMMUNITY): Payer: Self-pay

## 2023-11-26 ENCOUNTER — Ambulatory Visit: Payer: Self-pay

## 2023-11-26 LAB — COMPREHENSIVE METABOLIC PANEL WITH GFR
ALT: 25 IU/L (ref 0–44)
AST: 21 IU/L (ref 0–40)
Albumin: 4.8 g/dL (ref 4.1–5.1)
Alkaline Phosphatase: 58 IU/L (ref 47–123)
BUN/Creatinine Ratio: 13 (ref 9–20)
BUN: 13 mg/dL (ref 6–20)
Bilirubin Total: 0.3 mg/dL (ref 0.0–1.2)
CO2: 23 mmol/L (ref 20–29)
Calcium: 9.8 mg/dL (ref 8.7–10.2)
Chloride: 99 mmol/L (ref 96–106)
Creatinine, Ser: 1.04 mg/dL (ref 0.76–1.27)
Globulin, Total: 2.7 g/dL (ref 1.5–4.5)
Glucose: 76 mg/dL (ref 70–99)
Potassium: 4.3 mmol/L (ref 3.5–5.2)
Sodium: 138 mmol/L (ref 134–144)
Total Protein: 7.5 g/dL (ref 6.0–8.5)
eGFR: 95 mL/min/1.73 (ref >59)

## 2023-11-26 LAB — CBC WITH DIFFERENTIAL/PLATELET
Basophils Absolute: 0.1 x10E3/uL (ref 0.0–0.2)
Basos: 1 %
EOS (ABSOLUTE): 0.2 x10E3/uL (ref 0.0–0.4)
Eos: 2 %
Hematocrit: 49.6 % (ref 37.5–51.0)
Hemoglobin: 16.3 g/dL (ref 13.0–17.7)
Immature Grans (Abs): 0 x10E3/uL (ref 0.0–0.1)
Immature Granulocytes: 0 %
Lymphocytes Absolute: 2.3 x10E3/uL (ref 0.7–3.1)
Lymphs: 34 %
MCH: 29 pg (ref 26.6–33.0)
MCHC: 32.9 g/dL (ref 31.5–35.7)
MCV: 88 fL (ref 79–97)
Monocytes Absolute: 0.5 x10E3/uL (ref 0.1–0.9)
Monocytes: 8 %
Neutrophils Absolute: 3.8 x10E3/uL (ref 1.4–7.0)
Neutrophils: 55 %
Platelets: 300 x10E3/uL (ref 150–450)
RBC: 5.63 x10E6/uL (ref 4.14–5.80)
RDW: 13.2 % (ref 11.6–15.4)
WBC: 6.8 x10E3/uL (ref 3.4–10.8)

## 2023-11-26 LAB — HEMOGLOBIN A1C
Est. average glucose Bld gHb Est-mCnc: 97 mg/dL
Hgb A1c MFr Bld: 5 % (ref 4.8–5.6)

## 2023-11-26 LAB — LIPID PANEL
Chol/HDL Ratio: 5.5 ratio — ABNORMAL HIGH (ref 0.0–5.0)
Cholesterol, Total: 172 mg/dL (ref 100–199)
HDL: 31 mg/dL — ABNORMAL LOW (ref >39)
LDL Chol Calc (NIH): 93 mg/dL (ref 0–99)
Triglycerides: 284 mg/dL — ABNORMAL HIGH (ref 0–149)
VLDL Cholesterol Cal: 48 mg/dL — ABNORMAL HIGH (ref 5–40)

## 2023-11-26 LAB — TSH: TSH: 3.08 u[IU]/mL (ref 0.450–4.500)

## 2023-11-26 LAB — VITAMIN D 25 HYDROXY (VIT D DEFICIENCY, FRACTURES): Vit D, 25-Hydroxy: 21.2 ng/mL — ABNORMAL LOW (ref 30.0–100.0)

## 2023-11-26 MED ORDER — FENOFIBRATE 48 MG PO TABS
48.0000 mg | ORAL_TABLET | Freq: Every day | ORAL | 1 refills | Status: AC
  Filled 2023-11-26: qty 90, 90d supply, fill #0

## 2023-12-02 ENCOUNTER — Encounter

## 2023-12-20 ENCOUNTER — Ambulatory Visit (INDEPENDENT_AMBULATORY_CARE_PROVIDER_SITE_OTHER)

## 2023-12-20 VITALS — BP 120/75 | HR 82 | Temp 98.5°F | Ht 69.0 in | Wt 212.1 lb

## 2023-12-20 DIAGNOSIS — E559 Vitamin D deficiency, unspecified: Secondary | ICD-10-CM | POA: Diagnosis not present

## 2023-12-20 DIAGNOSIS — G4733 Obstructive sleep apnea (adult) (pediatric): Secondary | ICD-10-CM | POA: Insufficient documentation

## 2023-12-20 DIAGNOSIS — E7800 Pure hypercholesterolemia, unspecified: Secondary | ICD-10-CM

## 2023-12-20 DIAGNOSIS — Z0000 Encounter for general adult medical examination without abnormal findings: Secondary | ICD-10-CM | POA: Diagnosis not present

## 2023-12-20 NOTE — Assessment & Plan Note (Signed)
 Last lipid panel: LDL 93, HDL 31, Trig 284.  Discussed pancreatitis risk with high triglycerides. Initiated fenofibrate  when labs came back.  - Continue fenofibrate  and rosuvastatin . - Recheck triglyceride levels in January or February. - Lab appointment for cholesterol recheck after holidays.

## 2023-12-20 NOTE — Patient Instructions (Signed)
 VISIT SUMMARY: You had your annual physical exam today, and overall, your health is stable with no major concerns. We discussed your current medications and health conditions, and you are managing them well.  YOUR PLAN: HYPERTRIGLYCERIDEMIA: You have high triglyceride levels, which are being managed with fenofibrate  and rosuvastatin . -Continue taking fenofibrate  and rosuvastatin  as prescribed. -Recheck your triglyceride levels in January or February. -Schedule a lab appointment for a cholesterol recheck after the holidays.  VITAMIN D  DEFICIENCY: You have a mild vitamin D  deficiency, likely due to a recent lapse in supplementation. -Continue taking your current vitamin D  supplement. -We will recheck your vitamin D  levels with your next lab work. -If your vitamin D  levels remain low, we may consider a prescription vitamin D .  OBSTRUCTIVE SLEEP APNEA: You have obstructive sleep apnea, which is being managed with a CPAP machine. -Continue using your CPAP machine as you have been.  GENERAL HEALTH MAINTENANCE: Routine wellness visit with normal findings. -Continue routine health maintenance and screenings as per guidelines. -Schedule a colon cancer screening at age 23 unless your family history changes.  If you have any problems before your next visit feel free to message me via MyChart (minor issues or questions) or call the office, otherwise you may reach out to schedule an office visit.  Thank you! Saddie Sacks, PA-C

## 2023-12-20 NOTE — Assessment & Plan Note (Signed)
 Insufficiency likely due to lapse in supplementation. Discussed potential need for prescription vitamin D  in future if needed.  - Continue current vitamin D  supplementation. - Recheck vitamin D  levels with next lab work. - Consider prescription vitamin D  if levels remain low.

## 2023-12-20 NOTE — Assessment & Plan Note (Signed)
 Routine wellness visit with normal findings. Colon cancer screening to start at age 36 unless family history changes. Declined flu vaccine.  - Continue routine health maintenance and screenings as per guidelines. - Schedule colon cancer screening at age 36 unless family history changes.

## 2023-12-20 NOTE — Assessment & Plan Note (Signed)
 Managed with CPAP. Reports good sleep quality and no issues. - Continue CPAP therapy.

## 2023-12-20 NOTE — Progress Notes (Signed)
 Complete physical exam  Patient: John Barnes   DOB: 05-25-1985   38 y.o. Male  MRN: [id number]  Subjective:    Chief Complaint  Patient presents with   Annual Exam    History of Present Illness   John Barnes is a 38 year old male who presents for an annual physical exam.  Dyslipidemia - Currently managed with rosuvastatin  and recently initiated fenofibrate  due to previously elevated triglyceride levels - Tolerates fenofibrate  without adverse effects - No current medication refills needed aside from ongoing prescriptions for fenofibrate  and rosuvastatin   Obstructive sleep apnea - Diagnosed approximately three years ago - Uses CPAP machine with good adherence - Sleep quality is good - No longer tracks CPAP data on phone  Bowel habits - No constipation - Regular bowel movements  Tobacco use - Does not smoke          Most recent fall risk assessment:    12/20/2023    2:34 PM  Fall Risk   Falls in the past year? 0  Follow up Falls evaluation completed     Most recent depression screenings:    12/20/2023    2:34 PM 07/02/2023    8:11 AM  PHQ 2/9 Scores  PHQ - 2 Score 0 0  PHQ- 9 Score 0 0      Data saved with a previous flowsheet row definition    Vision:Within last year and Dental: No current dental problems and Receives regular dental care    Patient Care Team: Gayle Saddie JULIANNA DEVONNA as PCP - General (Physician Assistant)   Outpatient Medications Prior to Visit  Medication Sig   fenofibrate  (TRICOR ) 48 MG tablet Take 1 tablet (48 mg total) by mouth daily.   rosuvastatin  (CRESTOR ) 5 MG tablet Take 1 tablet (5 mg total) by mouth daily.   [DISCONTINUED] amoxicillin  (AMOXIL ) 500 MG capsule Take 2 capsules by mouth now then take 1 capsule 3 times daily until gone.   [DISCONTINUED] amoxicillin -clavulanate (AUGMENTIN ) 875-125 MG tablet Take 1 tablet by mouth 2 (two) times daily until gone   No facility-administered medications prior to visit.     ROS   Per HPI     Objective:     BP 120/75   Pulse 82   Temp 98.5 F (36.9 C) (Oral)   Ht 5' 9 (1.753 m)   Wt 212 lb 1.3 oz (96.2 kg)   SpO2 97%   BMI 31.32 kg/m    Physical Exam Constitutional:      General: He is not in acute distress.    Appearance: Normal appearance.  Eyes:     Pupils: Pupils are equal, round, and reactive to light.  Cardiovascular:     Rate and Rhythm: Normal rate and regular rhythm.     Heart sounds: Normal heart sounds. No murmur heard.    No friction rub. No gallop.  Pulmonary:     Effort: Pulmonary effort is normal. No respiratory distress.     Breath sounds: Normal breath sounds.  Abdominal:     General: Bowel sounds are normal.  Musculoskeletal:        General: No swelling.     Cervical back: Neck supple.  Lymphadenopathy:     Cervical: No cervical adenopathy.  Skin:    General: Skin is warm and dry.  Neurological:     General: No focal deficit present.     Mental Status: He is alert.  Psychiatric:        Mood and  Affect: Mood normal.        Behavior: Behavior normal.        Thought Content: Thought content normal.      No results found for any visits on 12/20/23. Last CBC Lab Results  Component Value Date   WBC 6.8 11/25/2023   HGB 16.3 11/25/2023   HCT 49.6 11/25/2023   MCV 88 11/25/2023   MCH 29.0 11/25/2023   RDW 13.2 11/25/2023   PLT 300 11/25/2023   Last metabolic panel Lab Results  Component Value Date   GLUCOSE 76 11/25/2023   NA 138 11/25/2023   K 4.3 11/25/2023   CL 99 11/25/2023   CO2 23 11/25/2023   BUN 13 11/25/2023   CREATININE 1.04 11/25/2023   EGFR 95 11/25/2023   CALCIUM  9.8 11/25/2023   PROT 7.5 11/25/2023   ALBUMIN 4.8 11/25/2023   LABGLOB 2.7 11/25/2023   AGRATIO 1.9 04/14/2022   BILITOT 0.3 11/25/2023   ALKPHOS 58 11/25/2023   AST 21 11/25/2023   ALT 25 11/25/2023   ANIONGAP 9 11/20/2021   Last lipids Lab Results  Component Value Date   CHOL 172 11/25/2023   HDL 31 (L)  11/25/2023   LDLCALC 93 11/25/2023   TRIG 284 (H) 11/25/2023   CHOLHDL 5.5 (H) 11/25/2023   Last hemoglobin A1c Lab Results  Component Value Date   HGBA1C 5.0 11/25/2023   Last thyroid functions Lab Results  Component Value Date   TSH 3.080 11/25/2023   Last vitamin D  Lab Results  Component Value Date   VD25OH 21.2 (L) 11/25/2023        Assessment & Plan:    Routine Health Maintenance and Physical Exam  Health Maintenance  Topic Date Due   Hepatitis B Vaccine (1 of 3 - 19+ 3-dose series) Never done   HPV Vaccine (1 - 3-dose SCDM series) Never done   COVID-19 Vaccine (1 - 2025-26 season) Never done   Flu Shot  04/18/2024*   DTaP/Tdap/Td vaccine (2 - Td or Tdap) 08/10/2027   Hepatitis C Screening  Completed   HIV Screening  Completed   Pneumococcal Vaccine  Aged Out   Meningitis B Vaccine  Aged Out  *Topic was postponed. The date shown is not the original due date.    Discussed health benefits of physical activity, and encouraged him to engage in regular exercise appropriate for his age and condition.  Elevated LDL cholesterol level Assessment & Plan: Last lipid panel: LDL 93, HDL 31, Trig 284.  Discussed pancreatitis risk with high triglycerides. Initiated fenofibrate  when labs came back.  - Continue fenofibrate  and rosuvastatin . - Recheck triglyceride levels in January or February. - Lab appointment for cholesterol recheck after holidays.  Orders: -     Lipid panel; Future -     Comprehensive metabolic panel with GFR; Future  Vitamin D  insufficiency Assessment & Plan: Insufficiency likely due to lapse in supplementation. Discussed potential need for prescription vitamin D  in future if needed.  - Continue current vitamin D  supplementation. - Recheck vitamin D  levels with next lab work. - Consider prescription vitamin D  if levels remain low.   Orders: -     VITAMIN D  25 Hydroxy (Vit-D Deficiency, Fractures); Future  OSA on CPAP Assessment &  Plan: Managed with CPAP. Reports good sleep quality and no issues. - Continue CPAP therapy.    Healthcare maintenance Assessment & Plan: Routine wellness visit with normal findings. Colon cancer screening to start at age 65 unless family history changes. Declined flu  vaccine.  - Continue routine health maintenance and screenings as per guidelines. - Schedule colon cancer screening at age 70 unless family history changes.    Return in about 1 year (around 12/19/2024) for Physical.     Saddie JULIANNA Sacks, PA-C

## 2024-02-07 ENCOUNTER — Other Ambulatory Visit (HOSPITAL_COMMUNITY): Payer: Self-pay

## 2024-02-07 ENCOUNTER — Other Ambulatory Visit: Payer: Self-pay | Admitting: Family Medicine

## 2024-02-07 DIAGNOSIS — E7800 Pure hypercholesterolemia, unspecified: Secondary | ICD-10-CM

## 2024-02-08 ENCOUNTER — Other Ambulatory Visit: Payer: Self-pay

## 2024-02-08 ENCOUNTER — Other Ambulatory Visit (HOSPITAL_COMMUNITY): Payer: Self-pay

## 2024-02-08 MED ORDER — ROSUVASTATIN CALCIUM 5 MG PO TABS
5.0000 mg | ORAL_TABLET | Freq: Every day | ORAL | 3 refills | Status: AC
  Filled 2024-02-08: qty 90, 90d supply, fill #0

## 2024-03-14 ENCOUNTER — Other Ambulatory Visit

## 2024-12-19 ENCOUNTER — Encounter
